# Patient Record
Sex: Female | Born: 1965 | Race: White | Hispanic: No | Marital: Married | Smoking: Never smoker
Health system: Southern US, Community
[De-identification: ages and names within clinical notes are randomized; demographics above are authoritative.]

## PROBLEM LIST (undated history)

## (undated) DIAGNOSIS — I10 Essential (primary) hypertension: Secondary | ICD-10-CM

## (undated) DIAGNOSIS — E119 Type 2 diabetes mellitus without complications: Secondary | ICD-10-CM

## (undated) HISTORY — PX: CHOLECYSTECTOMY OPEN: SUR202

## (undated) HISTORY — PX: TONSILLECTOMY: SUR1361

## (undated) HISTORY — PX: REFRACTIVE SURGERY: SHX103

## (undated) HISTORY — PX: PILONIDAL CYST EXCISION: SHX744

## (undated) HISTORY — DX: Essential (primary) hypertension: I10

## (undated) HISTORY — DX: Type 2 diabetes mellitus without complications: E11.9

---

## 1999-08-30 ENCOUNTER — Encounter: Admission: RE | Admit: 1999-08-30 | Discharge: 1999-11-28 | Payer: Self-pay | Admitting: Internal Medicine

## 1999-09-14 ENCOUNTER — Emergency Department (HOSPITAL_COMMUNITY): Admission: EM | Admit: 1999-09-14 | Discharge: 1999-09-14 | Payer: Self-pay | Admitting: Emergency Medicine

## 1999-11-24 ENCOUNTER — Emergency Department (HOSPITAL_COMMUNITY): Admission: EM | Admit: 1999-11-24 | Discharge: 1999-11-24 | Payer: Self-pay | Admitting: Emergency Medicine

## 2001-01-27 ENCOUNTER — Other Ambulatory Visit: Admission: RE | Admit: 2001-01-27 | Discharge: 2001-01-27 | Payer: Self-pay | Admitting: Obstetrics and Gynecology

## 2002-09-12 ENCOUNTER — Encounter: Payer: Self-pay | Admitting: Emergency Medicine

## 2002-09-12 ENCOUNTER — Inpatient Hospital Stay (HOSPITAL_COMMUNITY): Admission: EM | Admit: 2002-09-12 | Discharge: 2002-09-13 | Payer: Self-pay | Admitting: Emergency Medicine

## 2005-07-21 ENCOUNTER — Observation Stay (HOSPITAL_COMMUNITY): Admission: EM | Admit: 2005-07-21 | Discharge: 2005-07-22 | Payer: Self-pay | Admitting: Internal Medicine

## 2008-07-24 ENCOUNTER — Emergency Department (HOSPITAL_COMMUNITY): Admission: EM | Admit: 2008-07-24 | Discharge: 2008-07-24 | Payer: Self-pay | Admitting: Emergency Medicine

## 2009-08-07 ENCOUNTER — Emergency Department (HOSPITAL_COMMUNITY): Admission: EM | Admit: 2009-08-07 | Discharge: 2009-08-07 | Payer: Self-pay | Admitting: Emergency Medicine

## 2009-10-16 ENCOUNTER — Inpatient Hospital Stay (HOSPITAL_COMMUNITY): Admission: EM | Admit: 2009-10-16 | Discharge: 2009-10-18 | Payer: Self-pay | Admitting: Emergency Medicine

## 2009-10-16 ENCOUNTER — Ambulatory Visit: Payer: Self-pay | Admitting: Cardiovascular Disease

## 2009-10-17 ENCOUNTER — Encounter (INDEPENDENT_AMBULATORY_CARE_PROVIDER_SITE_OTHER): Payer: Self-pay | Admitting: Internal Medicine

## 2010-02-27 IMAGING — CT CT HEAD W/O CM
4 of 5 series · 15 of 47 positions shown, 17 images · non-contrast
Comparison: None

CLINICAL DATA: Fall.  Head trauma.  Syncope.

CT HEAD WITHOUT CONTRAST
TECHNIQUE: Contiguous axial images were obtained from the base of
the skull through the vertex without contrast.

[Series 2: headseq 4.8 h37s · axial · 0.43mm/px · z∈[+313,+373]mm · 2 of 36 slices shown]
[im 12/36  brain]
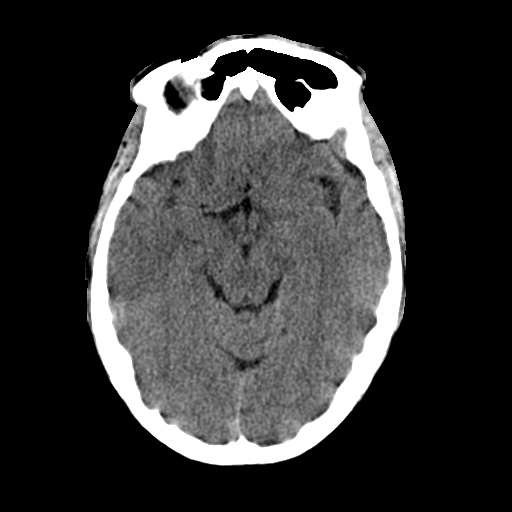
[im 24/36  brain]
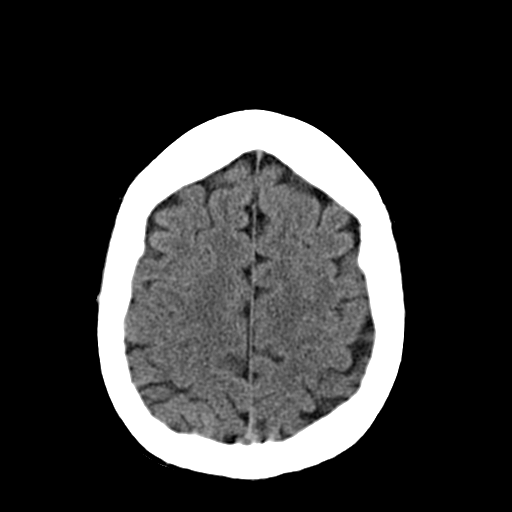

[Series 3: headseq 2.4 h60s · axial · 0.43mm/px · z∈[+280,+414]mm · 7 of 72 slices shown, 9 images]
[im 9/72  brain]
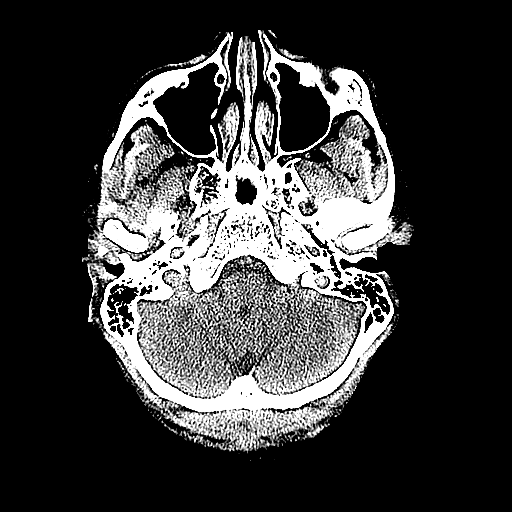
[im 9/72  bone]
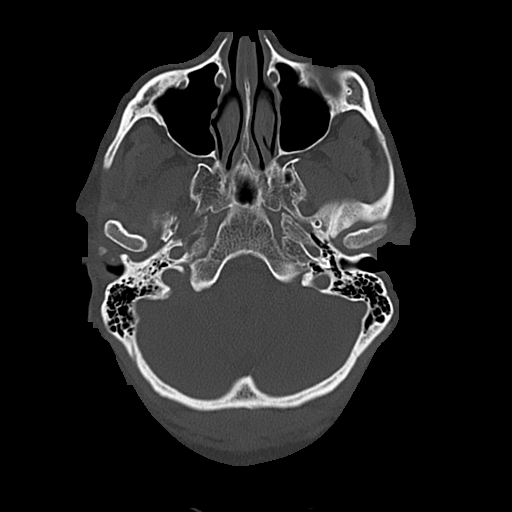
[im 18/72  brain]
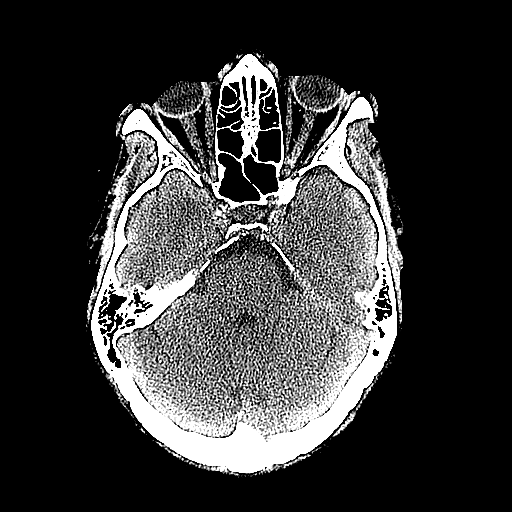
[im 27/72  brain]
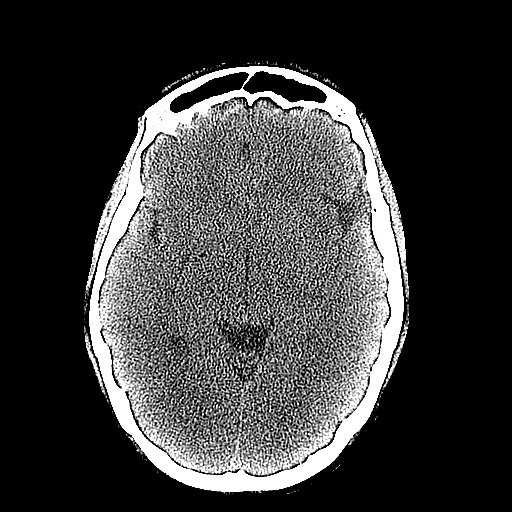
[im 36/72  brain]
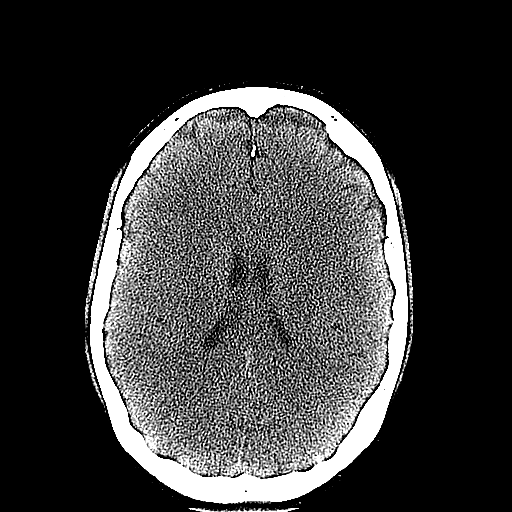
[im 45/72  brain]
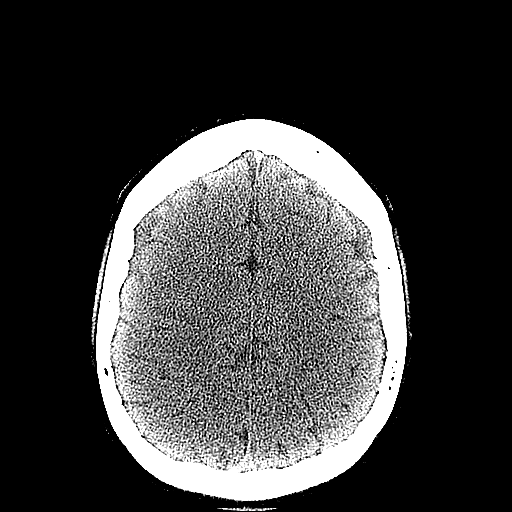
[im 45/72  bone]
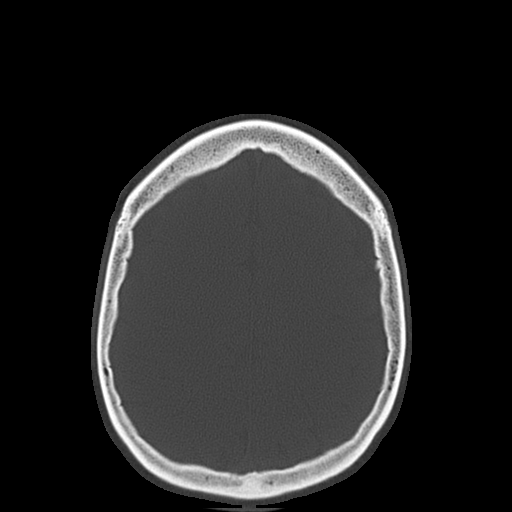
[im 54/72  brain]
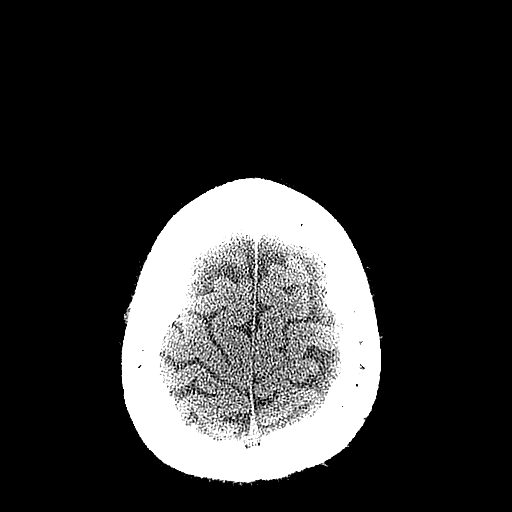
[im 63/72  brain]
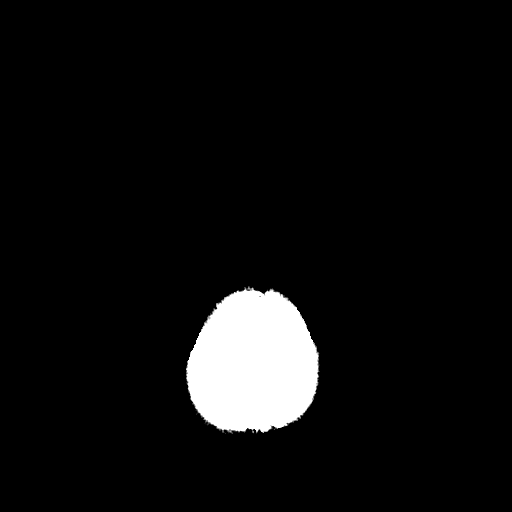

[Series 6: cervical 2.0 spo cor · coronal · 0.27mm/px · 3 of 75 slices shown]
[im 25/75  brain]
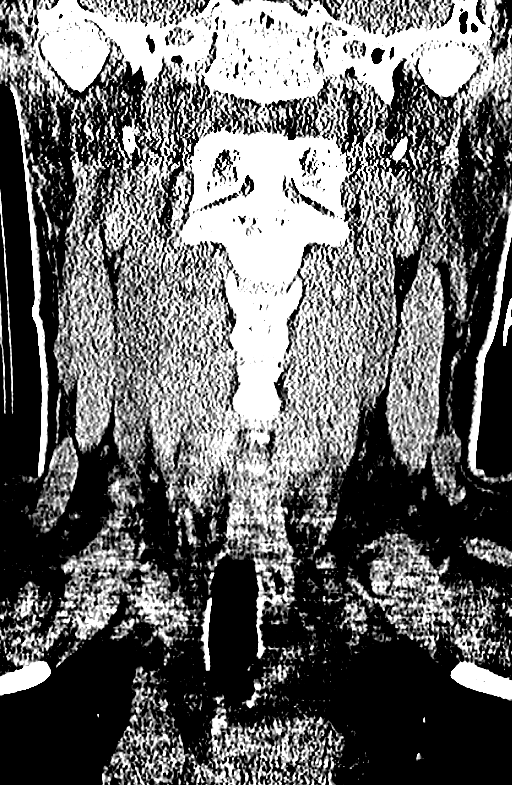
[im 33/75  brain]
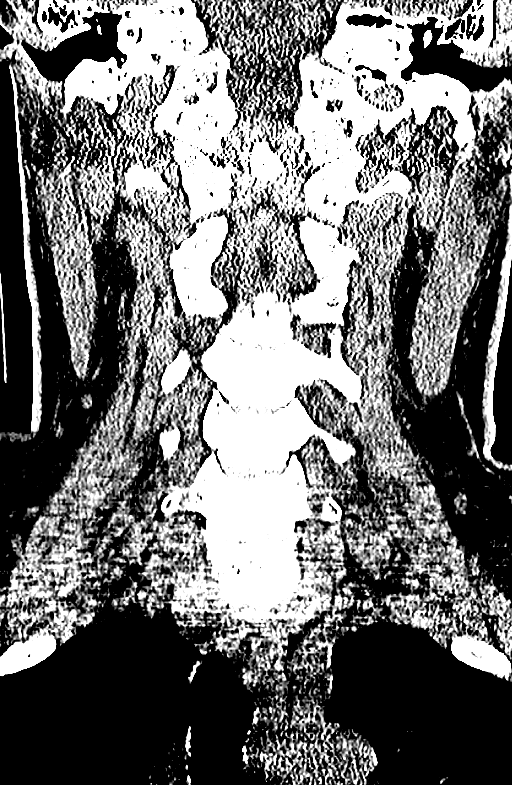
[im 42/75  brain]
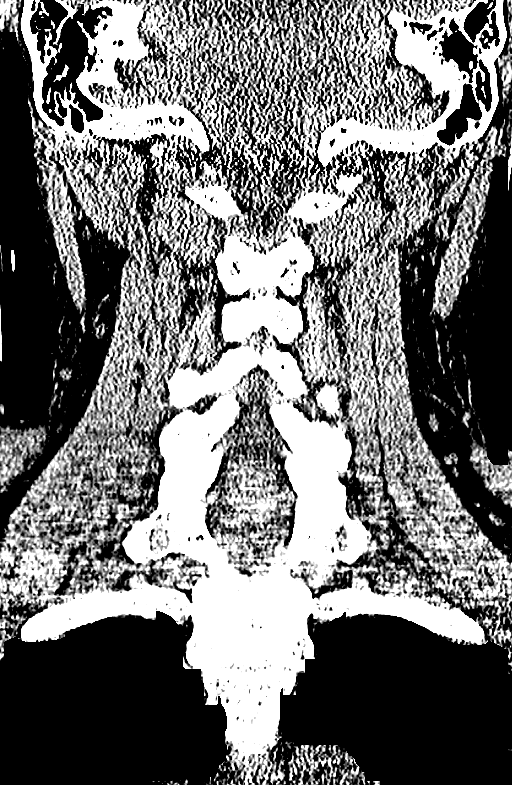

[Series 7: cervical 2.0 spo sag · sagittal · 0.27mm/px · 3 of 65 slices shown]
[im 22/65  brain]
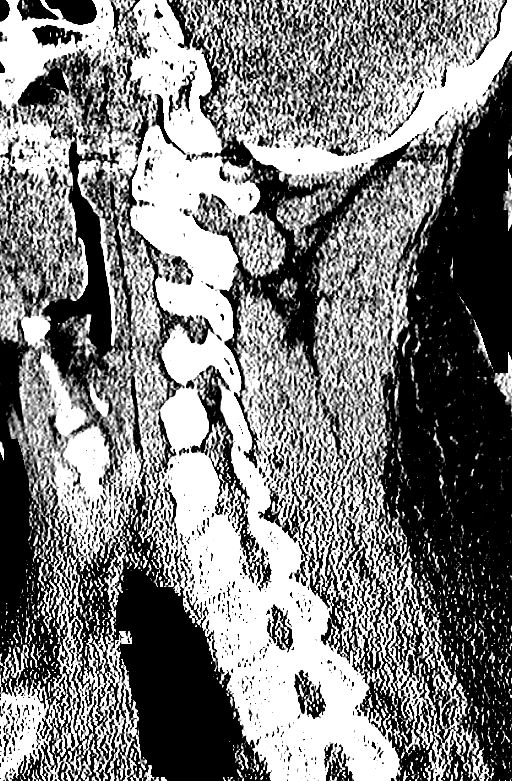
[im 33/65  brain]
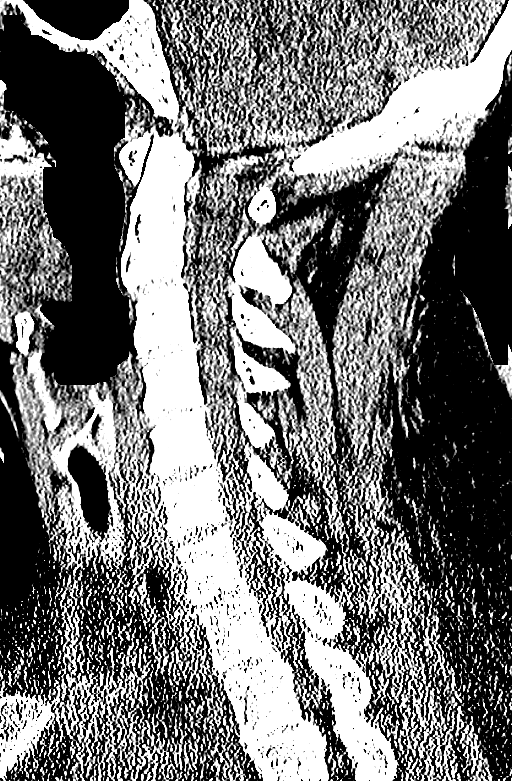
[im 43/65  brain]
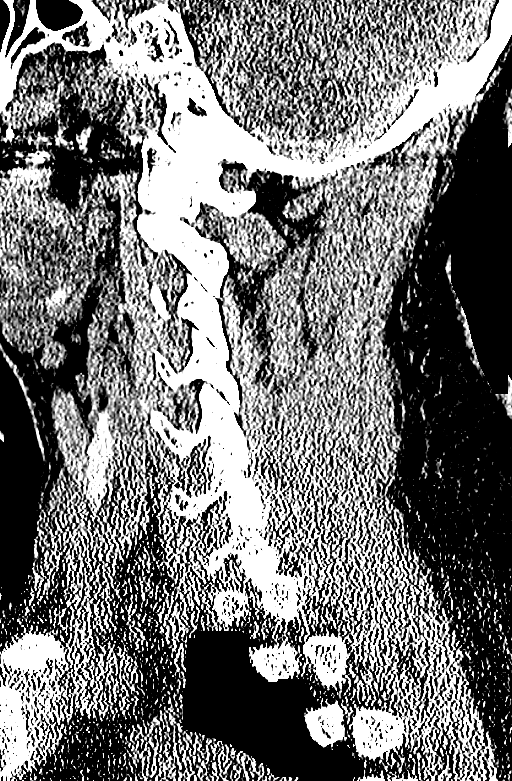

[15 of 47 positions shown; findings below may reference images not displayed]

FINDINGS: The brain has a normal appearance without evidence of
atrophy, infarction, mass lesion, hemorrhage, hydrocephalus or
extra-axial collection.  Sinuses, middle ears and mastoids are
clear.
IMPRESSION: Normal examination

## 2010-07-16 LAB — DIFFERENTIAL
Basophils Absolute: 0 K/uL (ref 0.0–0.1)
Basophils Relative: 0 % (ref 0–1)
Basophils Relative: 0 % (ref 0–1)
Basophils Relative: 0 % (ref 0–1)
Eosinophils Absolute: 0 K/uL (ref 0.0–0.7)
Eosinophils Relative: 0 % (ref 0–5)
Eosinophils Relative: 0 % (ref 0–5)
Lymphocytes Relative: 17 % (ref 12–46)
Lymphocytes Relative: 8 % — ABNORMAL LOW (ref 12–46)
Lymphs Abs: 1.1 10*3/uL (ref 0.7–4.0)
Lymphs Abs: 2 10*3/uL (ref 0.7–4.0)
Lymphs Abs: 2.2 K/uL (ref 0.7–4.0)
Monocytes Absolute: 0.5 10*3/uL (ref 0.1–1.0)
Monocytes Absolute: 0.8 K/uL (ref 0.1–1.0)
Monocytes Relative: 3 % (ref 3–12)
Monocytes Relative: 7 % (ref 3–12)
Monocytes Relative: 8 % (ref 3–12)
Neutro Abs: 3 10*3/uL (ref 1.7–7.7)
Neutro Abs: 9.5 K/uL — ABNORMAL HIGH (ref 1.7–7.7)
Neutrophils Relative %: 54 % (ref 43–77)
Neutrophils Relative %: 76 % (ref 43–77)
Neutrophils Relative %: 89 % — ABNORMAL HIGH (ref 43–77)

## 2010-07-16 LAB — BASIC METABOLIC PANEL WITH GFR
BUN: 16 mg/dL (ref 6–23)
BUN: 17 mg/dL (ref 6–23)
BUN: 19 mg/dL (ref 6–23)
BUN: 19 mg/dL (ref 6–23)
CO2: 17 meq/L — ABNORMAL LOW (ref 19–32)
CO2: 20 meq/L (ref 19–32)
CO2: 23 meq/L (ref 19–32)
CO2: 24 meq/L (ref 19–32)
Calcium: 8.1 mg/dL — ABNORMAL LOW (ref 8.4–10.5)
Calcium: 8.3 mg/dL — ABNORMAL LOW (ref 8.4–10.5)
Calcium: 8.6 mg/dL (ref 8.4–10.5)
Calcium: 8.6 mg/dL (ref 8.4–10.5)
Chloride: 106 meq/L (ref 96–112)
Chloride: 106 meq/L (ref 96–112)
Chloride: 108 meq/L (ref 96–112)
Chloride: 108 meq/L (ref 96–112)
Creatinine, Ser: 0.9 mg/dL (ref 0.4–1.2)
Creatinine, Ser: 0.92 mg/dL (ref 0.4–1.2)
Creatinine, Ser: 0.94 mg/dL (ref 0.4–1.2)
Creatinine, Ser: 0.99 mg/dL (ref 0.4–1.2)
GFR calc Af Amer: >60 mL/min (ref >60)
GFR calc Af Amer: >60 mL/min (ref >60)
GFR calc Af Amer: >60 mL/min (ref >60)
GFR calc Af Amer: >60 mL/min (ref >60)
GFR calc non Af Amer: >60 mL/min (ref >60)
GFR calc non Af Amer: >60 mL/min (ref >60)
GFR calc non Af Amer: >60 mL/min (ref >60)
GFR calc non Af Amer: >60 mL/min (ref >60)
Glucose, Bld: 250 mg/dL — ABNORMAL HIGH (ref 70–99)
Glucose, Bld: 289 mg/dL — ABNORMAL HIGH (ref 70–99)
Glucose, Bld: 367 mg/dL — ABNORMAL HIGH (ref 70–99)
Glucose, Bld: 385 mg/dL — ABNORMAL HIGH (ref 70–99)
Potassium: 3.9 meq/L (ref 3.5–5.1)
Potassium: 4 meq/L (ref 3.5–5.1)
Potassium: 4.7 meq/L (ref 3.5–5.1)
Potassium: 5.4 meq/L — ABNORMAL HIGH (ref 3.5–5.1)
Sodium: 133 meq/L — ABNORMAL LOW (ref 135–145)
Sodium: 133 meq/L — ABNORMAL LOW (ref 135–145)
Sodium: 135 meq/L (ref 135–145)
Sodium: 137 meq/L (ref 135–145)

## 2010-07-16 LAB — GLUCOSE, CAPILLARY
Glucose-Capillary: 107 mg/dL — ABNORMAL HIGH (ref 70–99)
Glucose-Capillary: 138 mg/dL — ABNORMAL HIGH (ref 70–99)
Glucose-Capillary: 151 mg/dL — ABNORMAL HIGH (ref 70–99)
Glucose-Capillary: 169 mg/dL — ABNORMAL HIGH (ref 70–99)
Glucose-Capillary: 186 mg/dL — ABNORMAL HIGH (ref 70–99)
Glucose-Capillary: 221 mg/dL — ABNORMAL HIGH (ref 70–99)
Glucose-Capillary: 241 mg/dL — ABNORMAL HIGH (ref 70–99)
Glucose-Capillary: 276 mg/dL — ABNORMAL HIGH (ref 70–99)
Glucose-Capillary: 297 mg/dL — ABNORMAL HIGH (ref 70–99)
Glucose-Capillary: 505 mg/dL — ABNORMAL HIGH (ref 70–99)

## 2010-07-16 LAB — COMPREHENSIVE METABOLIC PANEL WITH GFR
ALT: 16 U/L (ref 0–35)
AST: 19 U/L (ref 0–37)
Albumin: 2.7 g/dL — ABNORMAL LOW (ref 3.5–5.2)
Alkaline Phosphatase: 33 U/L — ABNORMAL LOW (ref 39–117)
BUN: 14 mg/dL (ref 6–23)
CO2: 23 meq/L (ref 19–32)
Calcium: 8.4 mg/dL (ref 8.4–10.5)
Chloride: 112 meq/L (ref 96–112)
Creatinine, Ser: 0.75 mg/dL (ref 0.4–1.2)
GFR calc Af Amer: >60 mL/min (ref >60)
GFR calc non Af Amer: >60 mL/min (ref >60)
Glucose, Bld: 166 mg/dL — ABNORMAL HIGH (ref 70–99)
Potassium: 3.9 meq/L (ref 3.5–5.1)
Sodium: 139 meq/L (ref 135–145)
Total Bilirubin: 0.3 mg/dL (ref 0.3–1.2)
Total Protein: 5.5 g/dL — ABNORMAL LOW (ref 6.0–8.3)

## 2010-07-16 LAB — CBC
HCT: 39.2 % (ref 36.0–46.0)
Hemoglobin: 11.4 g/dL — ABNORMAL LOW (ref 12.0–15.0)
Hemoglobin: 13.2 g/dL (ref 12.0–15.0)
MCHC: 33.4 g/dL (ref 30.0–36.0)
MCHC: 33.5 g/dL (ref 30.0–36.0)
Platelets: 224 10*3/uL (ref 150–400)
RDW: 14.1 % (ref 11.5–15.5)
RDW: 14.4 % (ref 11.5–15.5)
WBC: 14.4 10*3/uL — ABNORMAL HIGH (ref 4.0–10.5)

## 2010-07-16 LAB — URINE MICROSCOPIC-ADD ON

## 2010-07-16 LAB — BASIC METABOLIC PANEL
BUN: 18 mg/dL (ref 6–23)
BUN: 18 mg/dL (ref 6–23)
CO2: 17 mEq/L — ABNORMAL LOW (ref 19–32)
CO2: 21 mEq/L (ref 19–32)
CO2: 21 mEq/L (ref 19–32)
Calcium: 8.2 mg/dL — ABNORMAL LOW (ref 8.4–10.5)
Calcium: 8.5 mg/dL (ref 8.4–10.5)
Chloride: 109 mEq/L (ref 96–112)
Chloride: 110 mEq/L (ref 96–112)
Creatinine, Ser: 0.8 mg/dL (ref 0.4–1.2)
Creatinine, Ser: 0.82 mg/dL (ref 0.4–1.2)
Creatinine, Ser: 0.86 mg/dL (ref 0.4–1.2)
GFR calc Af Amer: >60 mL/min (ref >60)
GFR calc non Af Amer: 58 mL/min — ABNORMAL LOW (ref >60)
Glucose, Bld: 152 mg/dL — ABNORMAL HIGH (ref 70–99)
Glucose, Bld: 264 mg/dL — ABNORMAL HIGH (ref 70–99)
Glucose, Bld: 268 mg/dL — ABNORMAL HIGH (ref 70–99)
Glucose, Bld: 305 mg/dL — ABNORMAL HIGH (ref 70–99)
Potassium: 3.7 mEq/L (ref 3.5–5.1)
Potassium: 5.1 mEq/L (ref 3.5–5.1)
Sodium: 134 mEq/L — ABNORMAL LOW (ref 135–145)
Sodium: 136 mEq/L (ref 135–145)

## 2010-07-16 LAB — URINALYSIS, ROUTINE W REFLEX MICROSCOPIC
Bilirubin Urine: NEGATIVE
Protein, ur: NEGATIVE mg/dL
Specific Gravity, Urine: 1.02 (ref 1.005–1.030)
Urobilinogen, UA: 0.2 mg/dL (ref 0.0–1.0)

## 2010-07-16 LAB — COMPREHENSIVE METABOLIC PANEL
AST: 23 U/L (ref 0–37)
BUN: 21 mg/dL (ref 6–23)
CO2: 15 mEq/L — ABNORMAL LOW (ref 19–32)
Creatinine, Ser: 1.05 mg/dL (ref 0.4–1.2)
Potassium: 4.7 mEq/L (ref 3.5–5.1)
Sodium: 132 mEq/L — ABNORMAL LOW (ref 135–145)
Total Protein: 7 g/dL (ref 6.0–8.3)

## 2010-07-16 LAB — LIPASE, BLOOD: Lipase: 17 U/L (ref 11–59)

## 2010-07-16 LAB — LIPID PANEL
Cholesterol: 78 mg/dL (ref 0–200)
HDL: 47 mg/dL (ref >39)
LDL Cholesterol: 21 mg/dL (ref 0–99)
Total CHOL/HDL Ratio: 1.7 ratio
Triglycerides: 52 mg/dL (ref <150)
VLDL: 10 mg/dL (ref 0–40)

## 2010-07-16 LAB — URINE CULTURE: Culture: NO GROWTH

## 2010-07-16 LAB — KETONES, QUALITATIVE

## 2010-07-16 LAB — HEMOGLOBIN A1C
Hgb A1c MFr Bld: 7.1 % — ABNORMAL HIGH (ref <5.7)
Mean Plasma Glucose: 157 mg/dL — ABNORMAL HIGH (ref <117)

## 2010-07-19 LAB — PREGNANCY, URINE: Preg Test, Ur: NEGATIVE

## 2010-07-19 LAB — CBC
HCT: 38.3 % (ref 36.0–46.0)
Hemoglobin: 13.3 g/dL (ref 12.0–15.0)
MCHC: 34.6 g/dL (ref 30.0–36.0)
MCV: 92.6 fL (ref 78.0–100.0)
RBC: 4.13 MIL/uL (ref 3.87–5.11)

## 2010-07-19 LAB — DIFFERENTIAL
Basophils Relative: 0 % (ref 0–1)
Eosinophils Absolute: 0 10*3/uL (ref 0.0–0.7)
Eosinophils Relative: 0 % (ref 0–5)
Monocytes Absolute: 0.6 10*3/uL (ref 0.1–1.0)
Monocytes Relative: 5 % (ref 3–12)

## 2010-07-19 LAB — URINALYSIS, ROUTINE W REFLEX MICROSCOPIC
Hgb urine dipstick: NEGATIVE
Nitrite: NEGATIVE
Specific Gravity, Urine: <1.005 — ABNORMAL LOW (ref 1.005–1.030)
Urobilinogen, UA: 0.2 mg/dL (ref 0.0–1.0)
pH: 7 (ref 5.0–8.0)

## 2010-07-19 LAB — BASIC METABOLIC PANEL
CO2: 24 mEq/L (ref 19–32)
Chloride: 99 mEq/L (ref 96–112)
GFR calc Af Amer: >60 mL/min (ref >60)
Sodium: 131 mEq/L — ABNORMAL LOW (ref 135–145)

## 2010-07-19 LAB — GLUCOSE, CAPILLARY: Glucose-Capillary: 145 mg/dL — ABNORMAL HIGH (ref 70–99)

## 2010-07-19 LAB — URINE MICROSCOPIC-ADD ON

## 2010-07-19 LAB — URINE CULTURE

## 2010-09-15 NOTE — H&P (Signed)
NAME:  Christine Hess, Christine Hess                           ACCOUNT NO.:  [id number]   MEDICAL RECORD NO.:  [id number]                   PATIENT TYPE:  INP   LOCATION:  5705                                 FACILITY:  [facility]   PHYSICIAN:  Tera Mater. Evlyn Kanner, M.D.              DATE OF BIRTH:  1965-07-10   DATE OF ADMISSION:  09/12/2002  DATE OF DISCHARGE:                                HISTORY & PHYSICAL   HISTORY OF PRESENT ILLNESS:  Ms. Vannest is a 45 year old female, with known  type 1 diabetes since age of 31, who presents in diabetic ketoacidosis.  She  has had pump site dysfunction, requiring changing out of the set several  times in the recent days.  Blood sugar was okay last night, was up over 400  this morning, and this afternoon, despite giving what appeared to be extra  boluses, she still has nausea, vomiting and chest burning.  She has had some  sinus drainage that has been clear.  No fever, chills, sweats or significant  headaches.  She has had no diarrhea or GI symptoms prior to this.  She had  no neurologic symptoms, and no unusual travel exposure or rashes.  Her blood  sugars appears to have been under pretty good control, but the pump  dysfunction, apparently in the last few days, has caused them to be in the  200s and 400s occasionally.  The last episode of DKA was in high school.  She was recently seen for evaluation and was doing quite well with labs okay  except proteinuria.  Blood sugar was greater than 600, on arrival was  diminished bicarbonate and pH.  She did have some retching of clear bilious  material.  The chest burning she had briefly is gone away.  She denies any  blurred vision, no neuropathic complaints.   PAST MEDICAL HISTORY:  Type 1 diabetes at the age of 76.  She denies  retinopathy and neuropathy.  She did have proteinuria to a moderate degree.   MEDICATIONS:  Humalog and Accupril.   ALLERGIES:  Novocaine.   PAST SURGICAL HISTORY:  Cholecystectomy, pilonidal  cyst and tonsils.   SOCIAL HISTORY:  She has been married nine years, has no children.  She is a  nonsmoker.   FAMILY HISTORY:  Dad has type 2 diabetes, mother has hypertension and  fibromyalgia.   EXAMINATION:  VITAL SIGNS:  On exam blood pressure 102/39, pulse 107,  respirations 20, temperature 96.9, saturation is 96% on room air.  GENERAL:  We have a nontoxic appearing white female, sitting up in the  stretcher in no distress.  HEENT:  __________.  Sclerae anicteric, fundi are intact.  No lid lag or  exophthalmus present.  Pupils are equal and reactive.  Extraocular movements  intact, with no nystagmus.  Tongue is somewhat coated, with a yellowish  coating.  No dark areas or thrush is seen,  some dryness is noted.  Tympanic  membranes are normal bilaterally.  NECK:  Supple without JVD, bruits or thyromegaly.  LUNGS:  Clear without wheezes, rales, rhonchi, no Kussmaul pattern of  respirations present, no accessory muscle use.  HEART:  Tachycardic and regular without murmur.  ABDOMEN:  Soft, nontender, nondistended, with no masses or  hepatosplenomegaly, normoactive to slightly hypoactive bowel sounds are  present.  EXTREMITIES:  Reveal trace to 1+ edema bilaterally, with intact pulses.  No  foot lesions are present.  Capillary refill is good, no cyanosis is noted.  SKIN:  Warm and dry without rashes or pigmentary changes.  NEUROLOGIC:  She is awake, alert, mentating perfectly well.  Speech is  clear.  No tremors present.  No anxiety, depression, hallucinations or  delusions present.  Sensory is intact to light touch.   LABORATORY DATA:  A pH 7.313, pCO2 of 31, pO2 of 131, bicarb 16, sodium 133,  potassium 4.0, chloride 103, CO2 24, BUN 24, creatinine 0.5, glucose 221, CK  24, troponin 0.01.  Urinalysis 1.033 with pH of 6, glucose greater than  1,000, trace hemoglobin and ketones greater than 80 mg%, total protein 30,  serum acetone is small, white count 17,600, hemoglobin 13.3,  platelets  830,000.  Echocardiogram with a sinus rhythm, with  no ischemic changes, no  LVH, or evidence of old injury.   SUMMARY:  A 45 year old white female with known type 1 diabetes presenting  with significantly moderate DKA.  She had some fluid deficits, but no  significant electrolyte imbalance or disarray.  Her white count is up,  likely due to the DKA and the vomiting.  This all seems likely related to  pump dysfunction from all the history that is given and the examination.  No  evidence of infection or cardiac decompensation as a cause of this.  Initial  enzymes are negative, with a normal EKG in the setting, I do not think that  serial enzymes are warranted.  I believe she can turn around with IV  hydration and IV insulin with Glucometer.  She has already gotten some  Zofran and her nausea is gone, she is able to take some fluids.  She does  have an old pump that she can substitute for this new one, that seems to be  causing dysfunction.  We can get her husband bring it in, and perhaps let  her go tomorrow, if that is working well.  We will watch carefully for signs  of annoying infection or other reasons for decompensation.  At the present  time she is hemodynamically stable as well.  She will get aggressive  hydration for the next several hours and a careful watch of her  electrolytes.                                                Tera Mater. Evlyn Kanner, M.D.    SAS/MEDQ  D:  09/12/2002  T:  09/12/2002  Job:  [id number]

## 2010-09-15 NOTE — H&P (Signed)
NAMEKIELY, COUSAR                 ACCOUNT NO.:  [id number]   MEDICAL RECORD NO.:  [id number]          PATIENT TYPE:  INP   LOCATION:  2310                         FACILITY:  [facility]   PHYSICIAN:  Kari Baars, M.D.  DATE OF BIRTH:  05-23-1965   DATE OF ADMISSION:  07/20/2005  DATE OF DISCHARGE:  07/22/2005                                HISTORY & PHYSICAL   CHIEF COMPLAINT:  Nausea and vomiting.   HISTORY OF PRESENT ILLNESS:  Ms. Jarnagin is a 45 year old white female with  type 1 diabetes mellitus, on an insulin pump, who presented to the emergency  department twice today with a complaint of nausea, vomiting and diarrhea.  The patient reports that she was in her usual state of health this past  week, except for some mild URI/allergy symptoms.  She was actually seen in  the office yesterday and upgraded her pump from a MiniMed to an [place] after  receiving adequate training.  She felt very comfortable with the pump, and  it was initiated in the office.  This morning when she woke up, she felt  fatigued.  Later in the morning, her sugar climbed to over 400, and she  developed some nausea, vomiting and diarrhea.  She presented to the  emergency department early in the afternoon where her bicarbonate was 16 and  she had an elevated white blood count.  They told her that she had a viral  illness and discharged her home with antiemetic and antidiarrheal therapy.  Despite this, she continued to have nausea and vomiting and became  progressively more fatigued and developed some arm pain.  She felt that she  was having more shallow breathing and presented to the emergency department  where she was found to have an increased anion gap acidosis with ketones in  her urine.  She has been given 10 units of regular insulin and 1 liter of  fluid, and I have been asked to see her for further management.  The patient  has had a prior episode of DKA in 2004 due to a pump malfunction due to the  tubing.  She has been well controlled since then with an A1C recently of  6.7.  She does state that she has some retinopathy, but no known nephropathy  or neuropathy.   PAST MEDICAL HISTORY:  1.  Type 1 diabetes on insulin pump (A1C 6.7).  2.  Hypertension.   MEDICATIONS:  1.  Aspirin 81 mg daily.  2.  Accuretic, unknown dose.  3.  Animas pump.  4.  Multivitamin.   ALLERGIES:  NOVOCAIN.   SOCIAL HISTORY:  She lives in [place] with her husband.  They have no  children.  She is a Aeronautical engineer.  No tobacco use.   FAMILY HISTORY:  Father has lung cancer and type 2 diabetes.  Brother has  impaired glucose tolerance and bipolar disorder.  Mother has hypertension  and fibromyalgia.   REVIEW OF SYSTEMS:  All systems were reviewed with the patient and were  negative, except in the HPI, with the following exceptions.  She  does have  some mild chest fullness, but no chest pain.  Bilateral arm pain.  No cough.   PHYSICAL EXAMINATION:  VITAL SIGNS:  Temperature 98.1, blood pressure  125/66, pulse 123, respirations 20, oxygen saturation 100% on room air.  GENERAL:  A dehydrated, fatigued white female in no respiratory distress.  HEENT:  Oropharynx is dry.  Eyes are sunken.  NECK:  Supple without lymphadenopathy, JVD or carotid bruits.  HEART:  Tachycardic without murmurs, rubs, or gallops.  LUNGS:  Clear to auscultation bilaterally.  ABDOMEN:  Soft, nondistended, nontender, with normoactive bowel sounds.  EXTREMITIES:  No clubbing, cyanosis, or edema.  NEUROLOGIC:  Alert and oriented x4.  No disorientation.  Nonfocal exam.   LABORATORY DATA:  CBC significant for a white count of 17.4, hemoglobin  11.9, platelets 338.  BMET significant for a sodium of 136, potassium 5.6,  chloride 106, bicarbonate 16, BUN 22, creatinine 1.2, glucose 494.  Urinalysis with 0-2 white blood cells, ketones greater than 80, glucose  greater than 1000.   ASSESSMENT AND PLAN:  1.  Diabetic  ketoacidosis.  Will admit her to the ICU for aggressive      hydration, insulin drip per Glucommander protocol, and close monitoring      of her ketoacidosis.  When her sugars are less than 250, will change her      IV fluids to D5W and continue the insulin drip until her acidosis      resolves.  This episode is likely due to pump malfunction, and we will      ask the diabetes coordinators to assist [place] with interrogating the pump.      Once she is stable, we may be able to transition her back onto an      insulin pump, but it may be best to resume her prior pump at this point.  2.  Nausea/vomiting, diarrhea.  This is likely due to #1 rather than the      underlying cause.  Will continue supportive therapy with antiemetics and      hydration.  Obtain a urine pregnancy, although the patient states that      she does not think that she could be pregnant.  3.  Hypertension.  Hold Accuretic for now.  Will monitor.  4.  Disposition.  Anticipate discharge in 48-72 hours if she is stable and      able to be transitioned back to her insulin pump.  5.  Fluids, electrolytes, and nutrition.  NPO for now.  Will transition to a      clear liquid diet as tolerated in the morning.      Kari Baars, M.D.  Electronically Signed     WS/MEDQ  D:  07/21/2005  T:  07/23/2005  Job:  [id number]   cc:   Gwen Pounds, MD  Fax: [phone]

## 2010-09-15 NOTE — Discharge Summary (Signed)
NAME:  Christine Hess, Christine Hess                           ACCOUNT NO.:  [id number]   MEDICAL RECORD NO.:  [id number]                   PATIENT TYPE:  INP   LOCATION:  5705                                 FACILITY:  [facility]   PHYSICIAN:  Tera Mater. Evlyn Kanner, M.D.              DATE OF BIRTH:  1965/07/09   DATE OF ADMISSION:  09/12/2002  DATE OF DISCHARGE:  09/13/2002                                 DISCHARGE SUMMARY   DISCHARGE DIAGNOSES:  1. Diabetic ketoacidosis secondary to pump dysfunction, clinically improved     with complete biochemical and clinical resolution.  2. Nausea and vomiting secondary to number 1.  3. Chest pain secondary to number 1 with no evidence of ischemic cardiac     disease or damage.  4. Nephropathy.   HISTORY OF PRESENT ILLNESS:  The patient is a 45 year old white female with  a known history of type 1 diabetes since the age of 57.  She has been on an  insulin pump for several years and has not had any serious decompensation or  hospitalization since her high-school days.  She had pump dysfunction as  outlined within the history and physical, and presented with blood sugar in  excess of 600 with a slightly diminished bicarbonate level and positive  serum ketones.  She was treated with IV fluid resuscitation and IV  __________ and now is being transitioned back to her old insulin pump and  will have to get the new one repaired.  She has done extremely well,  tolerating her diet.  No nausea, vomiting, chest pain or other problems.  She has had no evidence of fluid overload while here.  She is back to her  clinical baseline and feeling quite well.  She is discharged home in  improved condition.   LABORATORY REVIEW:  Her labs today show a white count of 14,600, hemoglobin  11.8, platelets 320,000.  Sodium 140, potassium 3.6, chloride 112, CO2 25,  BUN 19, creatinine 0.8.  Glucose 119, calcium 8.5.  At presentation, sodium  133, potassium 4.8, chloride 103, CO2 22, glucose  621, BUN 24, creatinine  1.1.  Serum ketones were small and now are negative.  An initial hemoglobin  was 13.3.  Initial platelets were 330,000.  Initial white count was 17,000.  A urinalysis was 1.033 specific gravity, pH of 6.0. Glucose greater than 80  mg  per dl ketones and 30 mg percent protein.   A cardiogram in the ED was sinus rhythm with no ischemic changes.  No  evidence of old or new problems.  Initial CPK was 24 with troponin of 0.01.   In summary, we have a 45 year old white female with known type 1 diabetes  presenting with moderately-severe diabetic ketoacidosis due to pump  dysfunction.  Fortunately, she has reversed and is now being discharged  approximately 18 hours after presentation here.  She is to resume her prior  pump settings using her old pump.  She is to call if her blood sugars are  consistently over 250 or drop significantly below 60.  She is to watch  carefully for signs of infection although none were noted while here.  She  is to have follow up in the office in two to three weeks.   MEDICATIONS:  Continue the Accupril and get her back on her pump.                                               Tera Mater. Evlyn Kanner, M.D.    SAS/MEDQ  D:  09/13/2002  T:  09/14/2002  Job:  [id number]

## 2010-09-15 NOTE — Discharge Summary (Signed)
NAMEMICHELINA, Christine Hess                 ACCOUNT NO.:  [id number]   MEDICAL RECORD NO.:  [id number]          PATIENT TYPE:  INP   LOCATION:  2310                         FACILITY:  [facility]   PHYSICIAN:  Kari Baars, M.D.  DATE OF BIRTH:  1966-02-27   DATE OF ADMISSION:  07/21/2005  DATE OF DISCHARGE:  07/22/2005                                 DISCHARGE SUMMARY   DISCHARGE DIAGNOSES:  1.  Diabetic ketoacidosis, likely secondary to insertion site malfunction.  2.  Type 1 diabetes mellitus on animus pump.  3.  Nausea/vomiting secondary to #1.  4.  Sore throat.  5.  Hypertension.   HOSPITAL PROCEDURES:  Insulin drip per Glucomander protocol.   DISCHARGE MEDICATIONS:  1.  Animus pump with Humalog insulin on prior settings.  2.  Aspirin 81 mg daily.  3.  Accuretic resume in 2 days.  4.  Aspirin 81 mg daily.  5.  Magic mouthwash 5 mL q.6h. p.r.n. sore throat.   HISTORY OF PRESENT ILLNESS:  For full details, please see dictated history  and physical.  Briefly, Christine Hess is a pleasant very insightful 45 year old  white female with type 1 diabetes on an insulin pump who presented to the  emergency department on two occasions on July 20, 2005 with nausea,  vomiting and diarrhea.  The patient was in her usual state of health with  some mild allergy symptoms in the week prior to admission.  On the day prior  to presentation, she had a pump upgrade to the new animus pump and which was  initiated in the office.  She initially did well until that evening when her  pump site dislodged.  She changed the tubing site and inserted it on the  other side.  The following morning, her blood sugar was elevated at 400.  She tried to obtain a new insertion site, but her sugar remained elevated.  She presented to the emergency department where her bicarb was 16 and she  had an elevated white blood count.  She was discharged home from the  emergency department initially with a diagnosis of viral  gastroenteritis.  She continued to have elevated blood sugars and nausea, vomiting and  represented to the emergency department where she was found to have  worsening anion gap acidosis with ketones in her urine consistent with  diabetic ketoacidosis.   HOSPITAL COURSE:  The patient was evaluated in the emergency department and  placed on aggressive volume resuscitation and IV insulin drip.  She was  continued on insulin per Glucomander protocol with resolution of her anion  gap acidosis.  Her nausea and vomiting resolved and she was tolerating  p.o.'s.  On the evening prior to discharge, she was started back on her  animus pump and monitored closely in the ICU setting.  Her blood sugar  control remained adequate with this change with blood sugars less than 140  in the 10 hours prior to discharge.  She did have periodic lows due to  aggressive bolusing with the close monitoring but remained above 64 in the  past 10 hours.  At this point, she is tolerating p.o.'s and her pump appears  to be working fine.  Therefore, she is stable for discharge home with  outpatient follow-up.   On the morning of discharge, she does complain of some mild sore throat.  Exam does not reveal any significant exudates or lymphadenopathy.  This  likely due to her nausea and vomiting and pharyngeal irritation.  Magic  mouthwash was prescribed as needed.   DISCHARGE INSTRUCTIONS:  She was instructed to monitor blood sugars closely  over the next few days, checking it at least six to seven times per day.  She should call for blood sugar increases above 300 or she if is unable to  keep p.o.'s down.  She was instructed to call [facility] or Dr. Timothy Lasso if  she has any more concerns or problems with her pump.  At this point, she  feels very comfortable with things.   DISCHARGE LABS:  White blood count on the morning of discharge 10.5,  hemoglobin 10.2, platelets 251,000.  BMET:  Sodium 137, potassium 4.1,   chloride 113, bicarb 20, BUN 14, creatinine 0.8, glucose 127.  This gives  her an anion gap of 4.  Serum pregnancy negative on admission.  Urine  culture multiple species, none predominant.  Likely contaminant.   DISPOSITION:  To home with her husband.      Kari Baars, M.D.  Electronically Signed     WS/MEDQ  D:  07/22/2005  T:  07/23/2005  Job:  [id number]   cc:   Gwen Pounds, MD  Fax: [phone]

## 2011-03-13 IMAGING — CT CT HEAD W/O CM
1 of 2 series · 13 of 30 positions shown, 17 images · non-contrast
Comparison: 07/24/2008

CLINICAL DATA: Dizziness.

CT HEAD WITHOUT CONTRAST
TECHNIQUE: Contiguous axial images were obtained from the base of
the skull through the vertex without contrast.

[Series 2: headseq 4.8 h37s · axial · 0.54mm/px · z∈[+1085,+1232]mm · 13 of 36 slices shown, 17 images]
[im 3/36  brain]
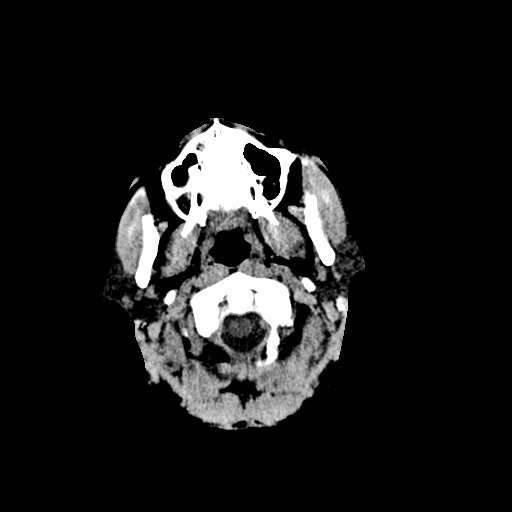
[im 3/36  bone]
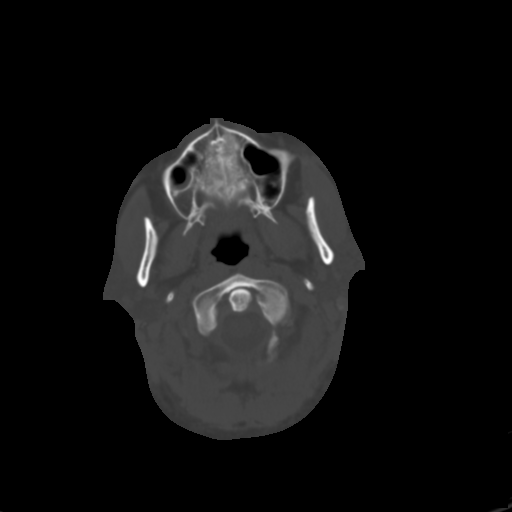
[im 6/36  brain]
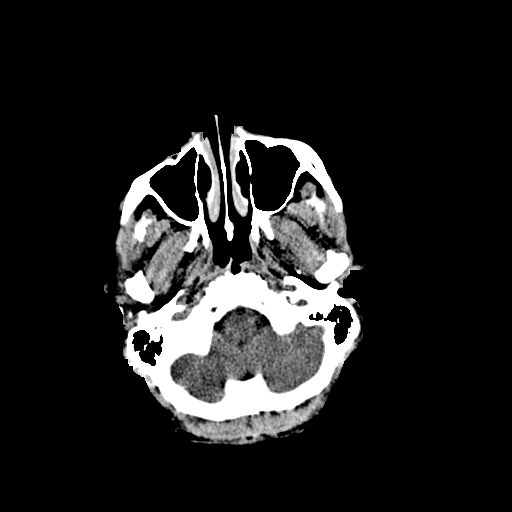
[im 8/36  brain]
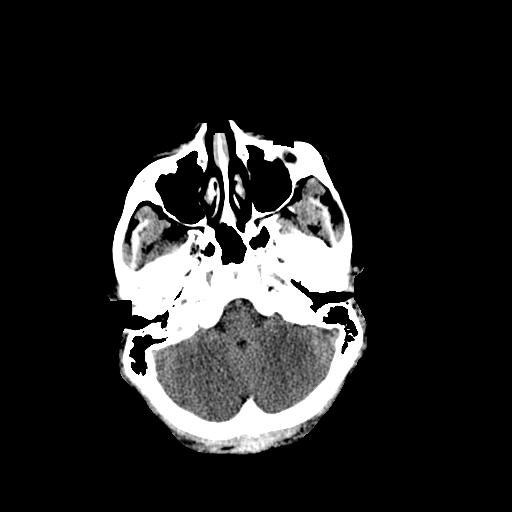
[im 11/36  brain]
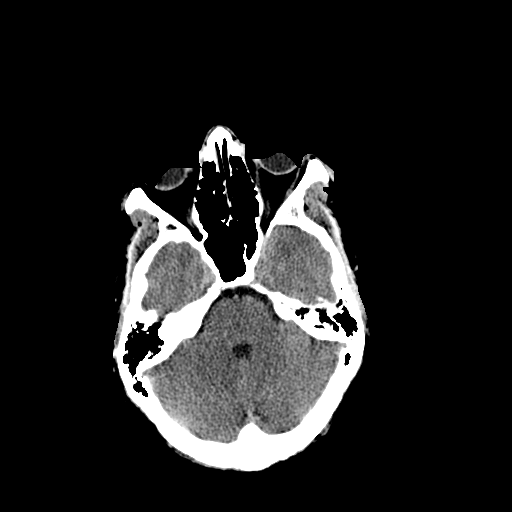
[im 13/36  brain]
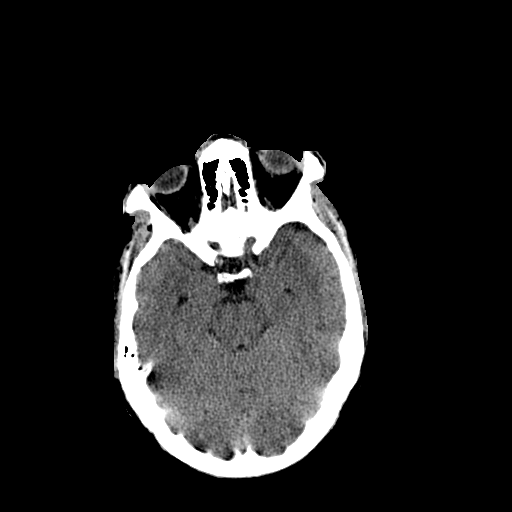
[im 13/36  bone]
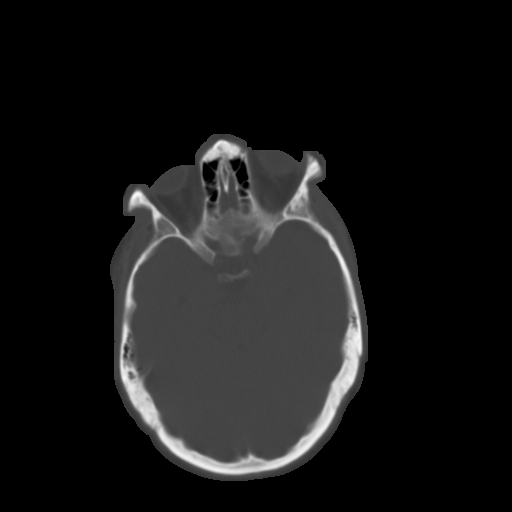
[im 16/36  brain]
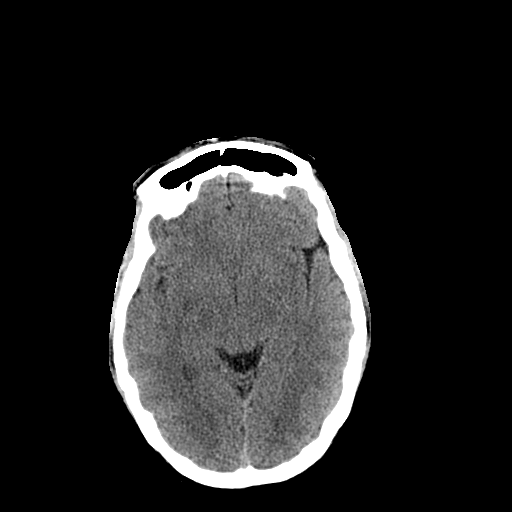
[im 18/36  brain]
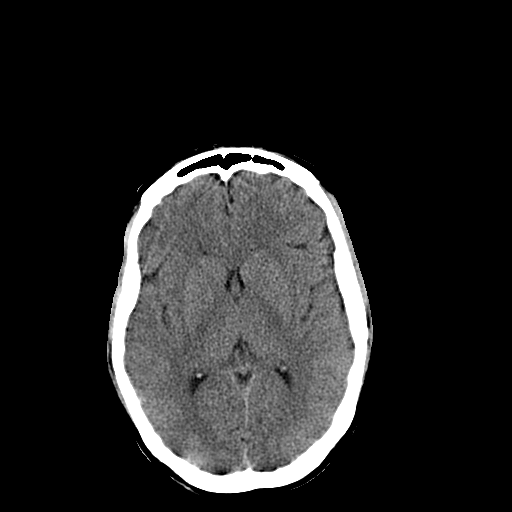
[im 21/36  brain]
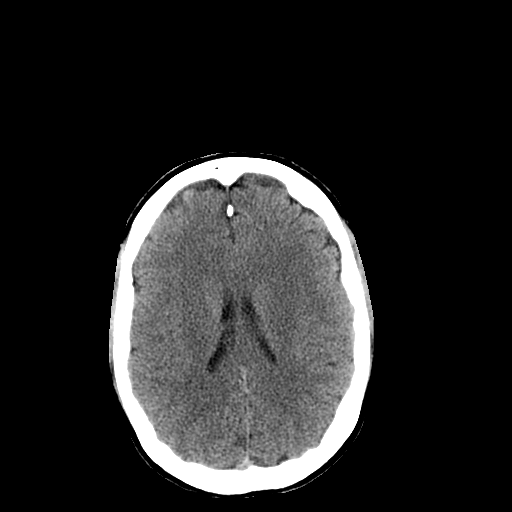
[im 23/36  brain]
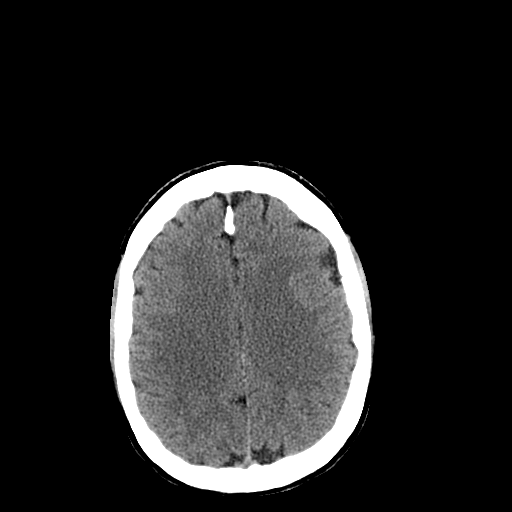
[im 23/36  bone]
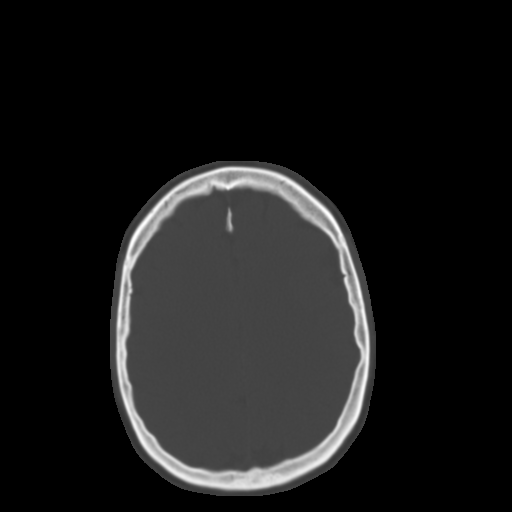
[im 26/36  brain]
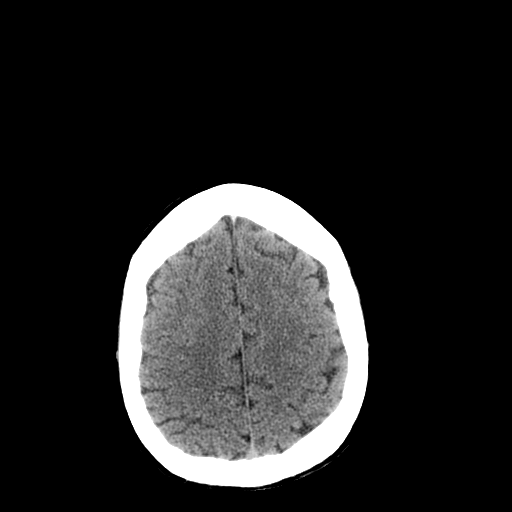
[im 28/36  brain]
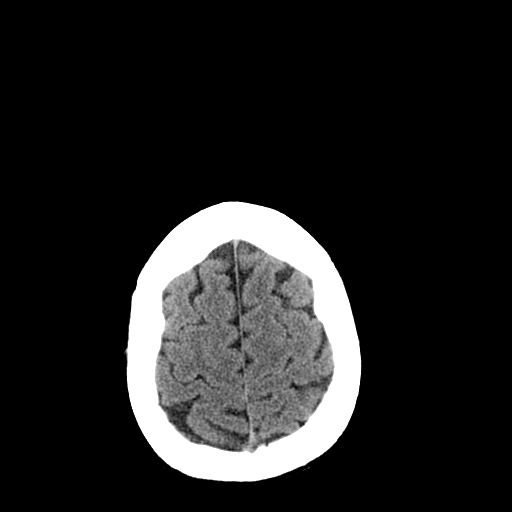
[im 31/36  brain]
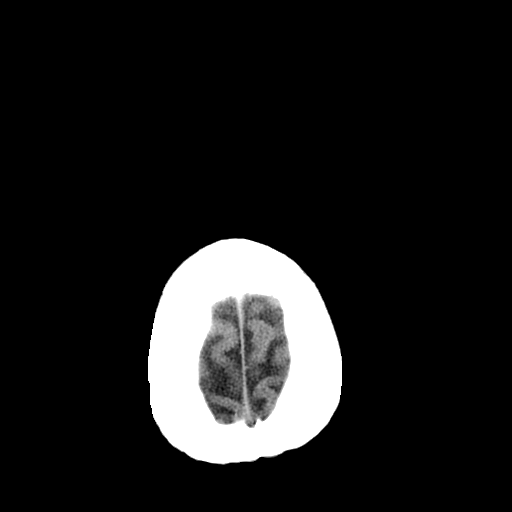
[im 33/36  brain]
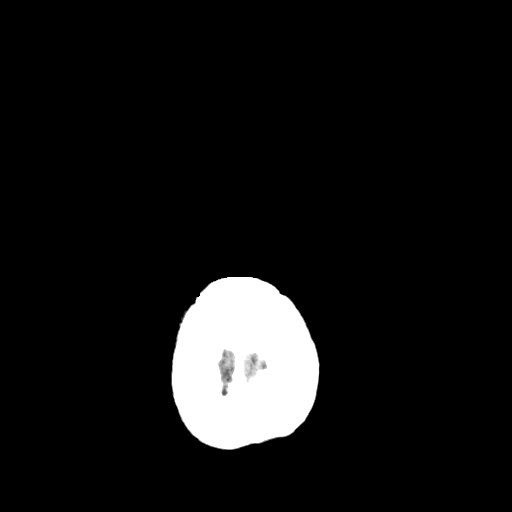
[im 33/36  bone]
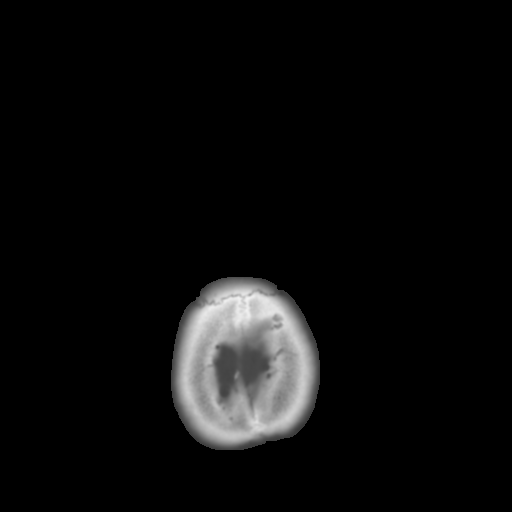

[13 of 30 positions shown; findings below may reference images not displayed]

FINDINGS: The ventricles are normal.  No extra-axial fluid
collections are seen.  The brainstem and cerebellum are
unremarkable.  No acute intracranial findings such as infarction or
hemorrhage.  No mass lesions.

The bony calvarium is intact.  The visualized paranasal sinuses and
mastoid air cells are clear.
IMPRESSION: No acute intracranial findings or mass lesions.

## 2011-05-22 IMAGING — CR DG CHEST 1V PORT
1 series · 1 of 1 positions shown · non-contrast
Comparison: None.

CLINICAL DATA: Hyperglycemia

PORTABLE CHEST - 1 VIEW

[view not recorded]
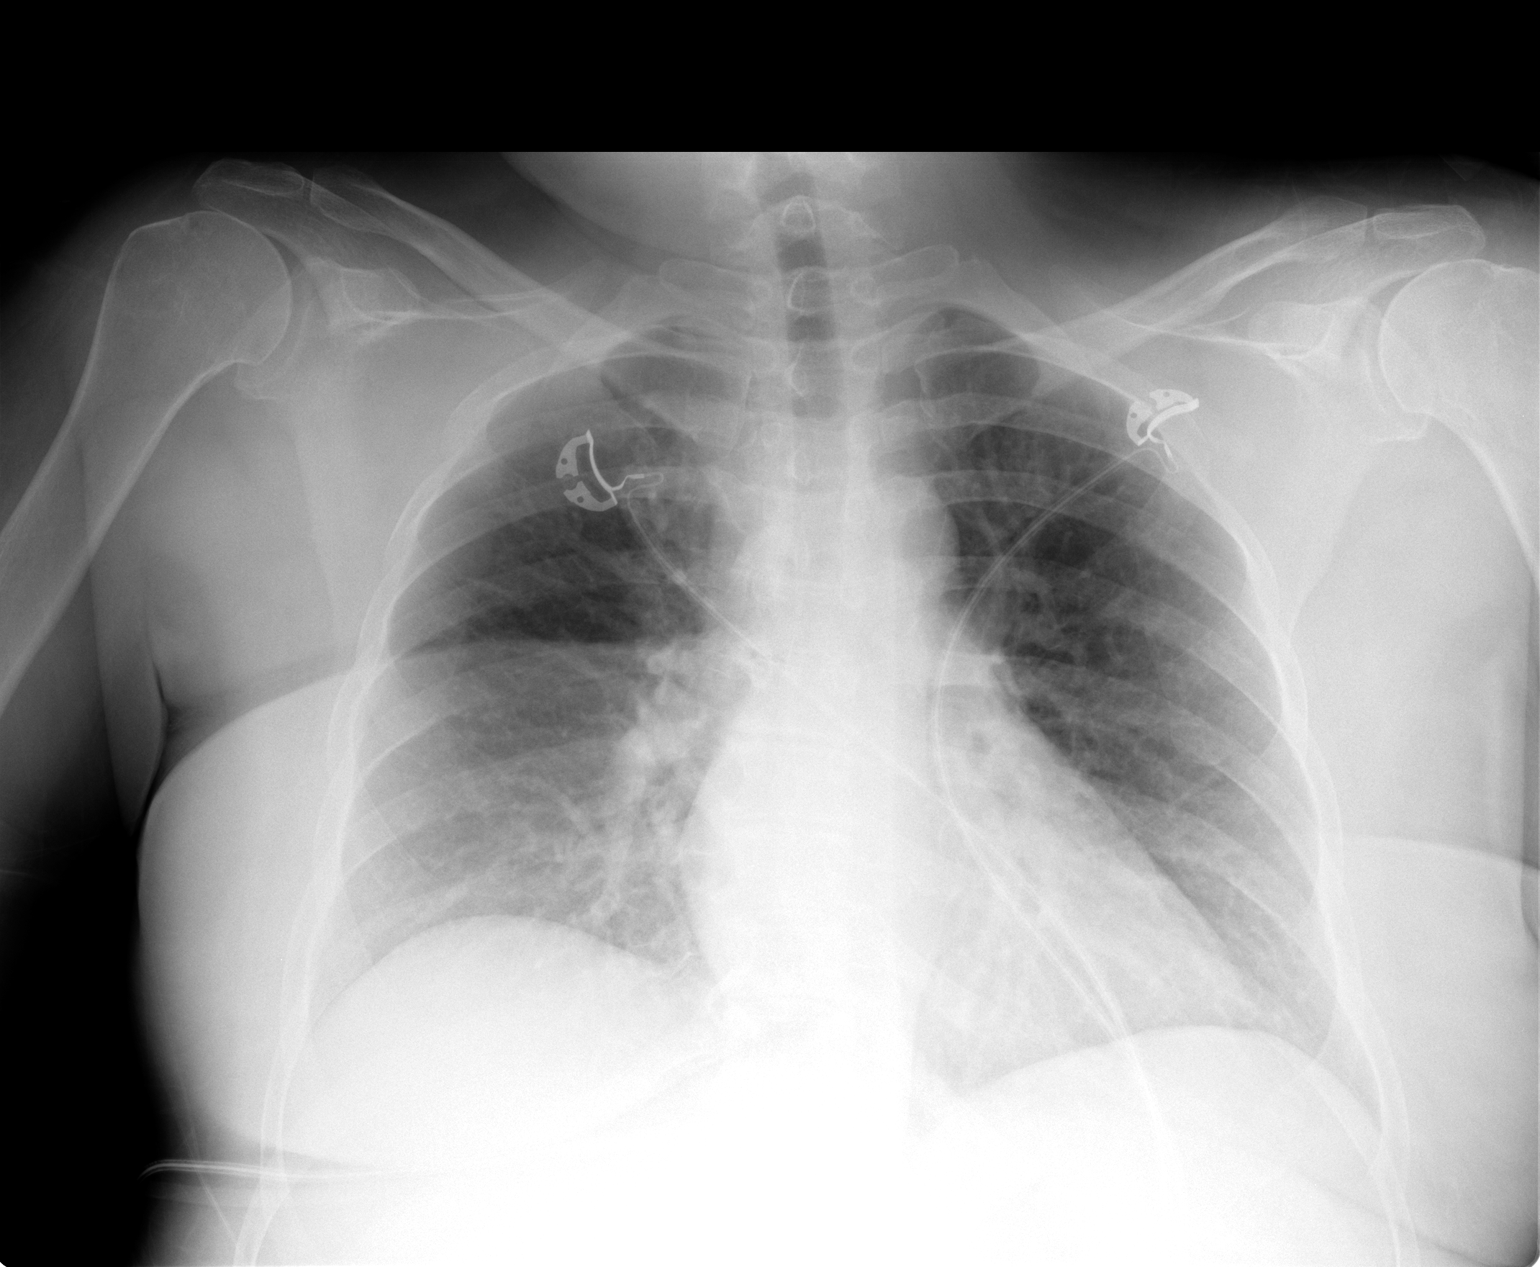

[1 of 1 positions shown; findings below may reference images not displayed]

FINDINGS: Normal heart size.  Clear lungs.  Low volumes.  No
pneumothorax.
IMPRESSION: No active cardiopulmonary disease.

## 2016-10-12 DIAGNOSIS — E108 Type 1 diabetes mellitus with unspecified complications: Secondary | ICD-10-CM | POA: Diagnosis not present

## 2017-02-26 DIAGNOSIS — E108 Type 1 diabetes mellitus with unspecified complications: Secondary | ICD-10-CM | POA: Diagnosis not present

## 2017-03-01 DIAGNOSIS — E108 Type 1 diabetes mellitus with unspecified complications: Secondary | ICD-10-CM | POA: Diagnosis not present

## 2017-06-06 DIAGNOSIS — E108 Type 1 diabetes mellitus with unspecified complications: Secondary | ICD-10-CM | POA: Diagnosis not present

## 2017-06-06 DIAGNOSIS — R82998 Other abnormal findings in urine: Secondary | ICD-10-CM | POA: Diagnosis not present

## 2017-06-06 DIAGNOSIS — Z0000 Encounter for general adult medical examination without abnormal findings: Secondary | ICD-10-CM | POA: Diagnosis not present

## 2017-06-13 DIAGNOSIS — Z1389 Encounter for screening for other disorder: Secondary | ICD-10-CM | POA: Diagnosis not present

## 2017-06-13 DIAGNOSIS — Z23 Encounter for immunization: Secondary | ICD-10-CM | POA: Diagnosis not present

## 2017-06-13 DIAGNOSIS — E11319 Type 2 diabetes mellitus with unspecified diabetic retinopathy without macular edema: Secondary | ICD-10-CM | POA: Diagnosis not present

## 2017-06-13 DIAGNOSIS — Z0000 Encounter for general adult medical examination without abnormal findings: Secondary | ICD-10-CM | POA: Diagnosis not present

## 2017-06-13 DIAGNOSIS — E1039 Type 1 diabetes mellitus with other diabetic ophthalmic complication: Secondary | ICD-10-CM | POA: Diagnosis not present

## 2017-06-21 DIAGNOSIS — I10 Essential (primary) hypertension: Secondary | ICD-10-CM | POA: Diagnosis not present

## 2017-06-21 DIAGNOSIS — E1039 Type 1 diabetes mellitus with other diabetic ophthalmic complication: Secondary | ICD-10-CM | POA: Diagnosis not present

## 2017-06-21 DIAGNOSIS — Z4681 Encounter for fitting and adjustment of insulin pump: Secondary | ICD-10-CM | POA: Diagnosis not present

## 2017-08-21 DIAGNOSIS — E108 Type 1 diabetes mellitus with unspecified complications: Secondary | ICD-10-CM | POA: Diagnosis not present

## 2017-08-21 DIAGNOSIS — E109 Type 1 diabetes mellitus without complications: Secondary | ICD-10-CM | POA: Diagnosis not present

## 2017-11-18 DIAGNOSIS — E109 Type 1 diabetes mellitus without complications: Secondary | ICD-10-CM | POA: Diagnosis not present

## 2017-11-18 DIAGNOSIS — E108 Type 1 diabetes mellitus with unspecified complications: Secondary | ICD-10-CM | POA: Diagnosis not present

## 2018-02-20 DIAGNOSIS — E109 Type 1 diabetes mellitus without complications: Secondary | ICD-10-CM | POA: Diagnosis not present

## 2018-02-20 DIAGNOSIS — E108 Type 1 diabetes mellitus with unspecified complications: Secondary | ICD-10-CM | POA: Diagnosis not present

## 2020-05-17 ENCOUNTER — Other Ambulatory Visit: Payer: Self-pay | Admitting: Internal Medicine

## 2020-05-17 DIAGNOSIS — Z0000 Encounter for general adult medical examination without abnormal findings: Secondary | ICD-10-CM

## 2021-07-11 ENCOUNTER — Other Ambulatory Visit: Payer: Self-pay | Admitting: Internal Medicine

## 2021-07-11 DIAGNOSIS — Z1231 Encounter for screening mammogram for malignant neoplasm of breast: Secondary | ICD-10-CM

## 2021-07-12 ENCOUNTER — Other Ambulatory Visit: Payer: Self-pay | Admitting: Internal Medicine

## 2021-07-12 DIAGNOSIS — Z0000 Encounter for general adult medical examination without abnormal findings: Secondary | ICD-10-CM

## 2021-08-11 ENCOUNTER — Other Ambulatory Visit: Payer: Self-pay

## 2021-09-08 ENCOUNTER — Other Ambulatory Visit: Payer: Self-pay

## 2021-10-06 ENCOUNTER — Other Ambulatory Visit: Payer: Self-pay

## 2021-10-20 ENCOUNTER — Ambulatory Visit
Admission: RE | Admit: 2021-10-20 | Discharge: 2021-10-20 | Disposition: A | Discharge status: Home / Self Care | Payer: Self-pay | Source: Ambulatory Visit | Attending: Internal Medicine | Admitting: Internal Medicine

## 2021-10-20 DIAGNOSIS — Z0000 Encounter for general adult medical examination without abnormal findings: Secondary | ICD-10-CM

## 2021-11-15 ENCOUNTER — Ambulatory Visit: Payer: Self-pay | Admitting: Cardiovascular Disease

## 2021-12-21 NOTE — Progress Notes (Signed)
Cardiology Office Note:   Date:  12/22/2021  NAME:  Christine Hess    MRN: [id number] DOB:  04-25-1966   PCP:  Creola Corn, MD  Cardiologist:  None  Electrophysiologist:  None   Referring MD: Creola Corn, MD   Chief Complaint  Patient presents with   Palpitations    History of Present Illness:   Christine Hess is a 56 y.o. female with a hx of DM, HTN, HLD, CAC who is being seen today for the evaluation of palpitations at the request of Creola Corn, MD. she reports for the past 2 years she has had episodes of palpitations.  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  She was started on carvedilol with improvement in symptoms.  Symptoms are occurring infrequently.  Maybe once per week and last seconds.  She has never wore a monitor.  Symptoms happened after COVID-19 vaccination.  She has a longstanding history of type 1 diabetes.  This is well controlled.  Recent coronary calcium scoring was elevated.  She was started on Crestor despite low LDL cholesterol.  I do agree with this.  Her blood pressure is well controlled.  She is obese with a BMI of 39.  She is not exercising.  She works for [employer].  She is married.  No children.  She reports she does not smoke.  No alcohol use or drug use.  Denies chest pain or shortness of breath today.  Family history notable for atrial fibrillation.  Problem List CAC -score 269 (99th percentile) 2. DM -A1c 6.8 3. HLD -T chol 110, HDL 55, TG 28, LDL 49 4. HTN  Past Medical History: Past Medical History:  Diagnosis Date   Diabetes (HCC)    Hypertension     Past Surgical History: Past Surgical History:  Procedure Laterality Date   CHOLECYSTECTOMY OPEN     PILONIDAL CYST EXCISION     REFRACTIVE SURGERY Bilateral    TONSILLECTOMY      Current Medications: Current Meds  Medication Sig   carvedilol (COREG) 3.125 MG tablet Take 3.125 mg by mouth 2 (two) times daily.   insulin lispro (HUMALOG) 100 UNIT/ML injection USE UP TO 100  UNITS DAILY VIA INSULIN PUMP for 90   lisinopril (ZESTRIL) 20 MG tablet Take 20 mg by mouth daily.   rosuvastatin (CRESTOR) 10 MG tablet Take 10 mg by mouth daily.     Allergies:    Procaine   Social History: Social History   Socioeconomic History   Marital status: Married    Spouse name: Not on file   Number of children: Not on file   Years of education: Not on file   Highest education level: Not on file  Occupational History   Occupation: [employer]  Tobacco Use   Smoking status: Never   Smokeless tobacco: Never  Substance and Sexual Activity   Alcohol use: Never   Drug use: Never   Sexual activity: Not on file  Other Topics Concern   Not on file  Social History Narrative   Not on file   Social Determinants of Health   Financial Resource Strain: Not on file  Food Insecurity: Not on file  Transportation Needs: Not on file  Physical Activity: Not on file  Stress: Not on file  Social Connections: Not on file     Family History: The patient's family history includes Arrhythmia in her father and mother.  ROS:   All other ROS reviewed and negative. Pertinent  positives noted in the HPI.     EKGs/Labs/Other Studies Reviewed:   The following studies were personally reviewed by me today:  EKG:  EKG is ordered today.  The ekg ordered today demonstrates normal sinus rhythm heart rate 78, nonspecific ST-T changes, and was personally reviewed by me.   Recent Labs: No results found for requested labs within last 365 days.   Recent Lipid Panel    Component Value Date/Time   CHOL  10/17/2009 0552    78        ATP III CLASSIFICATION:  <200     mg/dL   Desirable  741-287  mg/dL   Borderline High  >=867    mg/dL   High          TRIG 52 10/17/2009 0552   HDL 47 10/17/2009 0552   CHOLHDL 1.7 10/17/2009 0552   VLDL 10 10/17/2009 0552   LDLCALC  10/17/2009 0552    21        Total Cholesterol/HDL:CHD Risk Coronary Heart Disease Risk Table                      Men   Women  1/2 Average Risk   3.4   3.3  Average Risk       5.0   4.4  2 X Average Risk   9.6   7.1  3 X Average Risk  23.4   11.0        Use the calculated Patient Ratio above and the CHD Risk Table to determine the patient's CHD Risk.        ATP III CLASSIFICATION (LDL):  <100     mg/dL   Optimal  672-094  mg/dL   Near or Above                    Optimal  130-159  mg/dL   Borderline  709-628  mg/dL   High  >366     mg/dL   Very High    Physical Exam:   VS:  BP 122/74 (BP Location: Left Arm, Patient Position: Sitting, Cuff Size: Large)   Pulse 78   Ht 5\' 4"  (1.626 m)   Wt 233 lb (105.7 kg)   BMI 39.99 kg/m    Wt Readings from Last 3 Encounters:  12/22/21 233 lb (105.7 kg)    General: Well nourished, well developed, in no acute distress Head: Atraumatic, normal size  Eyes: PEERLA, EOMI  Neck: Supple, no JVD Endocrine: No thryomegaly Cardiac: Normal S1, S2; RRR; no murmurs, rubs, or gallops Lungs: Clear to auscultation bilaterally, no wheezing, rhonchi or rales  Abd: Soft, nontender, no hepatomegaly  Ext: No edema, pulses 2+ Musculoskeletal: No deformities, BUE and BLE strength normal and equal Skin: Warm and dry, no rashes   Neuro: Alert and oriented to person, place, time, and situation, CNII-XII grossly intact, no focal deficits  Psych: Normal mood and affect   ASSESSMENT:   Christine Hess is a 56 y.o. female who presents for the following: 1. Palpitations   2. Agatston coronary artery calcium score between 200 and 399   3. Mixed hyperlipidemia     PLAN:   1. Palpitations -History of palpitations since COVID-19 infection.  CV exam is normal.  EKG is normal.  Diagnosed with PVCs.  She has never wore a monitor.  Would recommend proceed with 14-day ZIO.  Recent thyroid normal.  Exam normal.  No need for an echo at this  time.  2. Agatston coronary artery calcium score between 200 and 399 3. Mixed hyperlipidemia -Elevated coronary calcium score related diabetes.   No symptoms of angina.  Agree with Crestor low-dose.  LDL cholesterol is acceptable.  She will see [place] yearly.  Disposition: Return if symptoms worsen or fail to improve.  Medication Adjustments/Labs and Tests Ordered: Current medicines are reviewed at length with the patient today.  Concerns regarding medicines are outlined above.  Orders Placed This Encounter  Procedures   LONG TERM MONITOR (3-14 DAYS)   EKG 12-Lead   No orders of the defined types were placed in this encounter.   Patient Instructions  Medication Instructions:  The current medical regimen is effective;  continue present plan and medications.  *If you need a refill on your cardiac medications before your next appointment, please call your pharmacy*   Testing/Procedures:  ZIO XT- Long Term Monitor Instructions  Your physician has requested you wear a ZIO patch monitor for 14 days.  This is a single patch monitor. [employer] supplies one patch monitor per enrollment. Additional stickers are not available. Please do not apply patch if you will be having a Nuclear Stress Test,  Echocardiogram, Cardiac CT, MRI, or Chest Xray during the period you would be wearing the  monitor. The patch cannot be worn during these tests. You cannot remove and re-apply the  ZIO XT patch monitor.  Your ZIO patch monitor will be mailed 3 day USPS to your address on file. It may take 3-5 days  to receive your monitor after you have been enrolled.  Once you have received your monitor, please review the enclosed instructions. Your monitor  has already been registered assigning a specific monitor serial # to you.  Billing and Patient Assistance Program Information  We have supplied [employer] with any of your insurance information on file for billing purposes. [employer] offers a sliding scale Patient Assistance Program for patients that do not have  insurance, or whose insurance does not completely cover the cost of the ZIO monitor.  You must  apply for the Patient Assistance Program to qualify for this discounted rate.  To apply, please call [employer] at [phone], select option 4, select option 2, ask to apply for  Patient Assistance Program. Meredeth Ide will ask your household income, and how many people  are in your household. They will quote your out-of-pocket cost based on that information.  [employer] will also be able to set up a 76-month, interest-free payment plan if needed.  Applying the monitor   Shave hair from upper left chest.  Hold abrader disc by orange tab. Rub abrader in 40 strokes over the upper left chest as  indicated in your monitor instructions.  Clean area with 4 enclosed alcohol pads. Let dry.  Apply patch as indicated in monitor instructions. Patch will be placed under collarbone on left  side of chest with arrow pointing upward.  Rub patch adhesive wings for 2 minutes. Remove white label marked "1". Remove the white  label marked "2". Rub patch adhesive wings for 2 additional minutes.  While looking in a mirror, press and release button in center of patch. A small green light will  flash 3-4 times. This will be your only indicator that the monitor has been turned on.  Do not shower for the first 24 hours. You may shower after the first 24 hours.  Press the button if you feel a symptom. You will hear a small click. Record Date, Time and  Symptom  in the Patient Logbook.  When you are ready to remove the patch, follow instructions on the last 2 pages of Patient  Logbook. Stick patch monitor onto the last page of Patient Logbook.  Place Patient Logbook in the blue and white box. Use locking tab on box and tape box closed  securely. The blue and white box has prepaid postage on it. Please place it in the mailbox as  soon as possible. Your physician should have your test results approximately 7 days after the  monitor has been mailed back to [facility].  Call [facility] Customer Care at [phone] if  you have questions regarding  your ZIO XT patch monitor. Call them immediately if you see an orange light blinking on your  monitor.  If your monitor falls off in less than 4 days, contact our Monitor department at [phone].  If your monitor becomes loose or falls off after 4 days call [employer] at [phone] for  suggestions on securing your monitor    Follow-Up: At [facility], you and your health needs are our priority.  As part of our continuing mission to provide you with exceptional heart care, we have created designated Provider Care Teams.  These Care Teams include your primary Cardiologist (physician) and Advanced Practice Providers (APPs -  Physician Assistants and Nurse Practitioners) who all work together to provide you with the care you need, when you need it.  We recommend signing up for the patient portal called "MyChart".  Sign up information is provided on this After Visit Summary.  MyChart is used to connect with patients for Virtual Visits (Telemedicine).  Patients are able to view lab/test results, encounter notes, upcoming appointments, etc.  Non-urgent messages can be sent to your provider as well.   To learn more about what you can do with MyChart, go to [email or web address].    Your next appointment:   12 month(s)  The format for your next appointment:   In Person  Provider:   Lennie Odor, MD              Signed, Lenna Gilford. Flora Lipps, MD, [facility]  [facility]  [address], [address] [place], [place] [address] [phone]  12/22/2021 2:13 PM

## 2021-12-22 ENCOUNTER — Encounter: Payer: Self-pay | Admitting: Cardiovascular Disease

## 2021-12-22 ENCOUNTER — Ambulatory Visit (INDEPENDENT_AMBULATORY_CARE_PROVIDER_SITE_OTHER): Payer: 59 | Admitting: Cardiovascular Disease

## 2021-12-22 ENCOUNTER — Ambulatory Visit (INDEPENDENT_AMBULATORY_CARE_PROVIDER_SITE_OTHER): Payer: 59

## 2021-12-22 VITALS — BP 122/74 | HR 78 | Ht 64.0 in | Wt 233.0 lb

## 2021-12-22 DIAGNOSIS — R931 Abnormal findings on diagnostic imaging of heart and coronary circulation: Secondary | ICD-10-CM | POA: Diagnosis not present

## 2021-12-22 DIAGNOSIS — E782 Mixed hyperlipidemia: Secondary | ICD-10-CM | POA: Diagnosis not present

## 2021-12-22 DIAGNOSIS — R0602 Shortness of breath: Secondary | ICD-10-CM

## 2021-12-22 DIAGNOSIS — R002 Palpitations: Secondary | ICD-10-CM

## 2021-12-22 NOTE — Progress Notes (Unsigned)
Enrolled for [employer] to mail a ZIO XT long term holter monitor to the patients address on file.  

## 2021-12-22 NOTE — Patient Instructions (Signed)
Medication Instructions:  The current medical regimen is effective;  continue present plan and medications.  *If you need a refill on your cardiac medications before your next appointment, please call your pharmacy*   Testing/Procedures:  [place] Monitor Instructions  Your physician has requested you wear a ZIO patch monitor for 14 days.  This is a single patch monitor. [employer] supplies one patch monitor per enrollment. Additional stickers are not available. Please do not apply patch if you will be having a Nuclear Stress Test,  Echocardiogram, Cardiac CT, MRI, or Chest Xray during the period you would be wearing the  monitor. The patch cannot be worn during these tests. You cannot remove and re-apply the  ZIO XT patch monitor.  Your ZIO patch monitor will be mailed 3 day USPS to your address on file. It may take 3-5 days  to receive your monitor after you have been enrolled.  Once you have received your monitor, please review the enclosed instructions. Your monitor  has already been registered assigning a specific monitor serial # to you.  Billing and Patient Assistance Program Information  We have supplied [employer] with any of your insurance information on file for billing purposes. [employer] offers a sliding scale Patient Assistance Program for patients that do not have  insurance, or whose insurance does not completely cover the cost of the ZIO monitor.  You must apply for the Patient Assistance Program to qualify for this discounted rate.  To apply, please call [employer] at [phone], select option 4, select option 2, ask to apply for  Patient Assistance Program. Theodore Demark will ask your household income, and how many people  are in your household. They will quote your out-of-pocket cost based on that information.  [employer] will also be able to set up a 29-month interest-free payment plan if needed.  Applying the monitor   Shave hair from upper left chest.  Hold abrader  disc by orange tab. Rub abrader in 40 strokes over the upper left chest as  indicated in your monitor instructions.  Clean area with 4 enclosed alcohol pads. Let dry.  Apply patch as indicated in monitor instructions. Patch will be placed under collarbone on left  side of chest with arrow pointing upward.  Rub patch adhesive wings for 2 minutes. Remove white label marked "1". Remove the white  label marked "2". Rub patch adhesive wings for 2 additional minutes.  While looking in a mirror, press and release button in center of patch. A small green light will  flash 3-4 times. This will be your only indicator that the monitor has been turned on.  Do not shower for the first 24 hours. You may shower after the first 24 hours.  Press the button if you feel a symptom. You will hear a small click. Record Date, Time and  Symptom in the Patient Logbook.  When you are ready to remove the patch, follow instructions on the last 2 pages of Patient  Logbook. Stick patch monitor onto the last page of Patient Logbook.  Place Patient Logbook in the blue and white box. Use locking tab on box and tape box closed  securely. The blue and white box has prepaid postage on it. Please place it in the mailbox as  soon as possible. Your physician should have your test results approximately 7 days after the  monitor has been [facility] [address]  Call [employer] [phone] you have questions regarding  your ZIO  XT patch monitor. Call them immediately if you see an orange light blinking on your  monitor.  If your monitor falls off in less than 4 days, contact our Monitor department at [phone].  If your monitor becomes loose or falls off after 4 days call [employer] at [phone] for  suggestions on securing your monitor    Follow-Up: At [facility], you and your health needs are our priority.  As part of our continuing mission to provide you with exceptional heart care, we  have created designated Provider Care Teams.  These Care Teams include your primary Cardiologist (physician) and Advanced Practice Providers (APPs -  Physician Assistants and Nurse Practitioners) who all work together to provide you with the care you need, when you need it.  We recommend signing up for the patient portal called "MyChart".  Sign up information is provided on this After Visit Summary.  MyChart is used to connect with patients for Virtual Visits (Telemedicine).  Patients are able to view lab/test results, encounter notes, upcoming appointments, etc.  Non-urgent messages can be sent to your provider as well.   To learn more about what you can do with MyChart, go to [email or web address].    Your next appointment:   12 month(s)  The format for your next appointment:   In Person  Provider:   Lennie Odor, MD

## 2021-12-27 ENCOUNTER — Ambulatory Visit: Payer: Self-pay | Admitting: Gastroenterology

## 2022-01-02 DIAGNOSIS — R002 Palpitations: Secondary | ICD-10-CM

## 2022-02-07 ENCOUNTER — Other Ambulatory Visit: Payer: Self-pay | Admitting: *Deleted

## 2022-02-07 DIAGNOSIS — I4719 Other supraventricular tachycardia: Secondary | ICD-10-CM

## 2022-02-07 MED ORDER — METOPROLOL SUCCINATE ER 25 MG PO TB24: 25.0000 mg | ORAL_TABLET | Freq: Every day | ORAL | 3 refills | Status: AC

## 2022-02-16 ENCOUNTER — Ambulatory Visit (INDEPENDENT_AMBULATORY_CARE_PROVIDER_SITE_OTHER): Payer: 59 | Admitting: Gastroenterology

## 2022-02-16 ENCOUNTER — Encounter: Payer: Self-pay | Admitting: Gastroenterology

## 2022-02-16 VITALS — BP 132/70 | HR 72 | Ht 64.0 in | Wt 243.0 lb

## 2022-02-16 DIAGNOSIS — Z1211 Encounter for screening for malignant neoplasm of colon: Secondary | ICD-10-CM

## 2022-02-16 DIAGNOSIS — R002 Palpitations: Secondary | ICD-10-CM | POA: Diagnosis not present

## 2022-02-16 DIAGNOSIS — E119 Type 2 diabetes mellitus without complications: Secondary | ICD-10-CM | POA: Diagnosis not present

## 2022-02-16 DIAGNOSIS — Z794 Long term (current) use of insulin: Secondary | ICD-10-CM

## 2022-02-16 NOTE — Patient Instructions (Addendum)
It has been recommended to you by your physician that you have a(n) colonoscopy completed. Per your request, we did not schedule the procedure(s) today. Please contact our office at [phone] should you decide to have the procedure completed. You will be scheduled for a pre-visit and procedure at that time.   If you are age 54 or younger, your body mass index should be between 19-25. Your Body mass index is 41.71 kg/m. If this is out of the aformentioned range listed, please consider follow up with your Primary Care Provider.   __________________________________________________________  The [facility] providers would like to encourage you to use [facility] to communicate with providers for non-urgent requests or questions.  Due to long hold times on the telephone, sending your provider a message by [facility] may be a faster and more efficient way to get a response.  Please allow 48 business hours for a response.  Please remember that this is for non-urgent requests.   Due to recent changes in healthcare laws, you may see the results of your imaging and laboratory studies on MyChart before your provider has had a chance to review them.  We understand that in some cases there may be results that are confusing or concerning to you. Not all laboratory results come back in the same time frame and the provider may be waiting for multiple results in order to interpret others.  Please give [place] 48 hours in order for your provider to thoroughly review all the results before contacting the office for clarification of your results.     Thank you for choosing me and [facility].  Vito Cirigliano, D.O.

## 2022-02-16 NOTE — Progress Notes (Signed)
              Chief Complaint: Discuss colon cancer screening, medication management in the perioperative setting   Referring Provider:     Shon Baton, MD    HPI:     Christine Hess is a 57 y.o. female with history of diabetes, HTN, HLD, referred to the Gastroenterology Clinic to discuss options for colon cancer screening and medication management in the perioperative setting.  History of diabetes, treated with insulin pump.  No prior colonoscopy or CRC screening.   No GI sxs.   Tends to have BG fall into 40's when fasting. DM and insulin pump managed by Dr. Shon Baton.   Has been having palpitations. Was seen in the Cardiology Clinic in August, completed Holter, and has TTE and f/u next week.   No known family history of CRC, GI malignancy, liver disease, pancreatic disease, or IBD.     Past Medical History:  Diagnosis Date   Diabetes ([place])    Hypertension      Past Surgical History:  Procedure Laterality Date   CHOLECYSTECTOMY OPEN     PILONIDAL CYST EXCISION     REFRACTIVE SURGERY Bilateral    TONSILLECTOMY     Family History  Problem Relation Age of Onset   Arrhythmia Mother    Arrhythmia Father    Social History   Tobacco Use   Smoking status: Never   Smokeless tobacco: Never  Substance Use Topics   Alcohol use: Never   Drug use: Never   Current Outpatient Medications  Medication Sig Dispense Refill   insulin lispro (HUMALOG) 100 UNIT/ML injection USE UP TO 100 UNITS DAILY VIA INSULIN PUMP for 90     lisinopril (ZESTRIL) 20 MG tablet Take 20 mg by mouth daily.     metoprolol succinate (TOPROL XL) 25 MG 24 hr tablet Take 1 tablet (25 mg total) by mouth daily. 90 tablet 3   rosuvastatin (CRESTOR) 10 MG tablet Take 10 mg by mouth daily.     No current facility-administered medications for this visit.   Allergies  Allergen Reactions   Other     Other reaction(s): Syncope Uncoded Allergy. Allergen: novacaine   Procaine     Other reaction(s):  Unknown     Review of Systems: All systems reviewed and negative except where noted in HPI.     Physical Exam:    Wt Readings from Last 3 Encounters:  02/16/22 243 lb (110.2 kg)  12/22/21 233 lb (105.7 kg)    BP 132/70   Pulse 72   Ht 5\' 4"  (1.626 m)   Wt 243 lb (110.2 kg)   BMI 41.71 kg/m  Constitutional:  Pleasant, in no acute distress. Psychiatric: Normal mood and affect. Behavior is normal. EENT: Pupils normal.  Conjunctivae are normal. No scleral icterus. Neck supple. No cervical LAD. Cardiovascular: Normal rate, regular rhythm. No edema Pulmonary/chest: Effort normal and breath sounds normal. No wheezing, rales or rhonchi. Abdominal: Soft, nondistended, nontender. Bowel sounds active throughout. There are no masses palpable. No hepatomegaly. Neurological: Alert and oriented to person place and time. Skin: Skin is warm and dry. No rashes noted.   ASSESSMENT AND PLAN;   1)  - Wll call us - Will need 8 AM appt    Lavena Bullion, DO, FACG  02/16/2022, 11:24 AM   Shon Baton, MD

## 2022-02-23 ENCOUNTER — Ambulatory Visit (HOSPITAL_COMMUNITY): Payer: 59 | Attending: Cardiovascular Disease

## 2022-02-23 DIAGNOSIS — I4719 Other supraventricular tachycardia: Secondary | ICD-10-CM

## 2022-02-23 LAB — ECHOCARDIOGRAM COMPLETE
Area-P 1/2: 3.6 cm2
S' Lateral: 2.8 cm

## 2022-02-26 NOTE — Progress Notes (Unsigned)
Cardiology Office Note:   Date:  02/28/2022  NAME:  Christine Hess    MRN: [id number] DOB:  [birth year]   PCP:  Shon Baton, MD  Cardiologist:  None  Electrophysiologist:  None   Referring MD: Shon Baton, MD   Chief Complaint  Patient presents with   Follow-up        History of Present Illness:   Christine Hess is a 56 y.o. female with a hx of atrial tachycardia who presents for follow-up.  Evaluated for palpitations.  Monitor showed brief atrial tachycardia episodes.  Started on metoprolol.  She reports no further palpitations since starting metoprolol succinate.  Doing well on this medication.  Blood pressure is well controlled.  Cholesterol well controlled.  Denies any chest pain or trouble breathing.  We discussed starting an exercise regimen.  She will look into this.  Symptoms are much improved.  We also discussed weight loss.  Her diabetes is well controlled.  She is working with her primary care physician on this.  Echocardiogram was normal.  No symptoms concerning for angina.  Problem List CAC -score 269 (99th percentile) 2. DM -A1c 6.8 3. HLD -T chol 110, HDL 55, TG 28, LDL 49 4. HTN  Past Medical History: Past Medical History:  Diagnosis Date   Diabetes ([place])    Hypertension     Past Surgical History: Past Surgical History:  Procedure Laterality Date   CHOLECYSTECTOMY OPEN     PILONIDAL CYST EXCISION     REFRACTIVE SURGERY Bilateral    TONSILLECTOMY      Current Medications: Current Meds  Medication Sig   insulin lispro (HUMALOG) 100 UNIT/ML injection USE UP TO 100 UNITS DAILY VIA INSULIN PUMP for 90   lisinopril (ZESTRIL) 20 MG tablet Take 20 mg by mouth daily.   metoprolol succinate (TOPROL XL) 25 MG 24 hr tablet Take 1 tablet (25 mg total) by mouth daily.   rosuvastatin (CRESTOR) 10 MG tablet Take 10 mg by mouth daily.     Allergies:    Other and Procaine   Social History: Social History   Socioeconomic History   Marital status: Married     Spouse name: Not on file   Number of children: Not on file   Years of education: Not on file   Highest education level: Not on file  Occupational History   Occupation: Office [employer]  Tobacco Use   Smoking status: Never   Smokeless tobacco: Never  Substance and Sexual Activity   Alcohol use: Never   Drug use: Never   Sexual activity: Not on file  Other Topics Concern   Not on file  Social History Narrative   Not on file   Social Determinants of Health   Financial Resource Strain: Not on file  Food Insecurity: Not on file  Transportation Needs: Not on file  Physical Activity: Not on file  Stress: Not on file  Social Connections: Not on file     Family History: The patient's family history includes Arrhythmia in her father and mother.  ROS:   All other ROS reviewed and negative. Pertinent positives noted in the HPI.     EKGs/Labs/Other Studies Reviewed:   The following studies were personally reviewed by me today:   TTE 02/23/2022  1. Left ventricular ejection fraction, by estimation, is 55 to 60%. The  left ventricle has normal function. The left ventricle has no regional  wall motion abnormalities. Left ventricular diastolic parameters were  normal. The average left  ventricular  global longitudinal strain is -20.4 %. The global longitudinal strain is  normal.   2. Right ventricular systolic function is normal. The right ventricular  size is normal.   3. Left atrial size was mildly dilated.   4. The mitral valve is abnormal. No evidence of mitral valve  regurgitation. No evidence of mitral stenosis.   5. The aortic valve is tricuspid. Aortic valve regurgitation is not  visualized. No aortic stenosis is present.   6. The inferior vena cava is normal in size with greater than 50%  respiratory variability, suggesting right atrial pressure of 3 mmHg.   [facility] 02/04/2022 Impression: Paroxysmal supraventricular tachycardia detected (327 episodes, longest duration  16.7 seconds).  Rare ectopy.   Recent Labs: No results found for requested labs within last 365 days.   Recent Lipid Panel    Component Value Date/Time   CHOL  10/17/2009 0552    78        ATP III CLASSIFICATION:  <200     mg/dL   Desirable  200-239  mg/dL   Borderline High  >=240    mg/dL   High          TRIG 52 10/17/2009 0552   HDL 47 10/17/2009 0552   CHOLHDL 1.7 10/17/2009 0552   VLDL 10 10/17/2009 0552   LDLCALC  10/17/2009 0552    21        Total Cholesterol/HDL:CHD Risk Coronary Heart Disease Risk Table                     Men   Women  1/2 Average Risk   3.4   3.3  Average Risk       5.0   4.4  2 X Average Risk   9.6   7.1  3 X Average Risk  23.4   11.0        Use the calculated Patient Ratio above and the CHD Risk Table to determine the patient's CHD Risk.        ATP III CLASSIFICATION (LDL):  <100     mg/dL   Optimal  100-129  mg/dL   Near or Above                    Optimal  130-159  mg/dL   Borderline  160-189  mg/dL   High  >190     mg/dL   Very High    Physical Exam:   VS:  BP (!) 140/77   Pulse 70   Ht 5\' 4"  (1.626 m)   Wt 241 lb 3.2 oz (109.4 kg)   SpO2 98%   BMI 41.40 kg/m    Wt Readings from Last 3 Encounters:  02/28/22 241 lb 3.2 oz (109.4 kg)  02/16/22 243 lb (110.2 kg)  12/22/21 233 lb (105.7 kg)    General: Well nourished, well developed, in no acute distress Head: Atraumatic, normal size  Eyes: PEERLA, EOMI  Neck: Supple, no JVD Endocrine: No thryomegaly Cardiac: Normal S1, S2; RRR; no murmurs, rubs, or gallops Lungs: Clear to auscultation bilaterally, no wheezing, rhonchi or rales  Abd: Soft, nontender, no hepatomegaly  Ext: No edema, pulses 2+ Musculoskeletal: No deformities, BUE and BLE strength normal and equal Skin: Warm and dry, no rashes   Neuro: Alert and oriented to person, place, time, and situation, CNII-XII grossly intact, no focal deficits  Psych: Normal mood and affect   ASSESSMENT:   Christine Hess is a 56  y.o. female who presents for the following: 1. Atrial tachycardia   2. Palpitations   3. Agatston coronary artery calcium score between 200 and 399   4. Mixed hyperlipidemia     PLAN:   1. Atrial tachycardia 2. Palpitations -Well-controlled on metoprolol succinate 25 mg daily.  She will continue with caffeine reduction.  She also has noticed hot chocolate may be a trigger.  She is declining a sleep study.  Her blood pressure is well controlled.  She will work with her doctor on diet and exercise.  3. Agatston coronary artery calcium score between 200 and 399 4. Mixed hyperlipidemia -LDL well controlled and at goal.  No symptoms of angina.  Normal echo.  Continue current medications.      Disposition: Return in about 6 months (around 08/29/2022).  Medication Adjustments/Labs and Tests Ordered: Current medicines are reviewed at length with the patient today.  Concerns regarding medicines are outlined above.  No orders of the defined types were placed in this encounter.  No orders of the defined types were placed in this encounter.   Patient Instructions  Medication Instructions:  The current medical regimen is effective;  continue present plan and medications.  *If you need a refill on your cardiac medications before your next appointment, please call your pharmacy*   Follow-Up: At [facility], you and your health needs are our priority.  As part of our continuing mission to provide you with exceptional heart care, we have created designated Provider Care Teams.  These Care Teams include your primary Cardiologist (physician) and Advanced Practice Providers (APPs -  Physician Assistants and Nurse Practitioners) who all work together to provide you with the care you need, when you need it.  We recommend signing up for the patient portal called "MyChart".  Sign up information is provided on this After Visit Summary.  MyChart is used to connect with patients for Virtual Visits  (Telemedicine).  Patients are able to view lab/test results, encounter notes, upcoming appointments, etc.  Non-urgent messages can be sent to your provider as well.   To learn more about what you can do with MyChart, go to [email or web address].    Your next appointment:   6 month(s)  The format for your next appointment:   In Person  Provider:   Eleonore Chiquito, MD          Time Spent with Patient: I have spent a total of 25 minutes with patient reviewing hospital notes, telemetry, EKGs, labs and examining the patient as well as establishing an assessment and plan that was discussed with the patient.  > 50% of time was spent in direct patient care.  Signed, Addison Naegeli. Audie Box, MD, [place]  [address], [place], [place] [address] [phone]  02/28/2022 10:13 AM

## 2022-02-28 ENCOUNTER — Ambulatory Visit: Payer: 59 | Attending: Cardiovascular Disease | Admitting: Cardiovascular Disease

## 2022-02-28 ENCOUNTER — Encounter: Payer: Self-pay | Admitting: Cardiovascular Disease

## 2022-02-28 VITALS — BP 140/77 | HR 70 | Ht 64.0 in | Wt 241.2 lb

## 2022-02-28 DIAGNOSIS — E782 Mixed hyperlipidemia: Secondary | ICD-10-CM | POA: Diagnosis not present

## 2022-02-28 DIAGNOSIS — I4719 Other supraventricular tachycardia: Secondary | ICD-10-CM

## 2022-02-28 DIAGNOSIS — R931 Abnormal findings on diagnostic imaging of heart and coronary circulation: Secondary | ICD-10-CM | POA: Diagnosis not present

## 2022-02-28 DIAGNOSIS — R002 Palpitations: Secondary | ICD-10-CM | POA: Diagnosis not present

## 2022-02-28 NOTE — Patient Instructions (Signed)
Medication Instructions:  The current medical regimen is effective;  continue present plan and medications.  *If you need a refill on your cardiac medications before your next appointment, please call your pharmacy*  Follow-Up: At [facility], you and your health needs are our priority.  As part of our continuing mission to provide you with exceptional heart care, we have created designated Provider Care Teams.  These Care Teams include your primary Cardiologist (physician) and Advanced Practice Providers (APPs -  Physician Assistants and Nurse Practitioners) who all work together to provide you with the care you need, when you need it.  We recommend signing up for the patient portal called "MyChart".  Sign up information is provided on this After Visit Summary.  MyChart is used to connect with patients for Virtual Visits (Telemedicine).  Patients are able to view lab/test results, encounter notes, upcoming appointments, etc.  Non-urgent messages can be sent to your provider as well.   To learn more about what you can do with MyChart, go to [email or web address].    Your next appointment:   6 month(s)  The format for your next appointment:   In Person  Provider:   [place] O'Neal, MD      

## 2024-06-03 ENCOUNTER — Other Ambulatory Visit (HOSPITAL_COMMUNITY): Payer: Self-pay | Admitting: Registered Nurse

## 2024-06-03 DIAGNOSIS — R9389 Abnormal findings on diagnostic imaging of other specified body structures: Secondary | ICD-10-CM

## 2024-06-03 DIAGNOSIS — J90 Pleural effusion, not elsewhere classified: Secondary | ICD-10-CM

## 2024-06-04 ENCOUNTER — Ambulatory Visit (HOSPITAL_COMMUNITY)

## 2024-06-04 ENCOUNTER — Other Ambulatory Visit (HOSPITAL_COMMUNITY)

## 2024-06-05 ENCOUNTER — Ambulatory Visit (HOSPITAL_COMMUNITY): Admission: RE | Admit: 2024-06-05 | Source: Ambulatory Visit

## 2024-06-05 ENCOUNTER — Inpatient Hospital Stay (HOSPITAL_COMMUNITY): Admission: RE | Admit: 2024-06-05

## 2024-06-05 DIAGNOSIS — R9389 Abnormal findings on diagnostic imaging of other specified body structures: Secondary | ICD-10-CM

## 2024-06-05 DIAGNOSIS — J90 Pleural effusion, not elsewhere classified: Secondary | ICD-10-CM

## 2024-06-05 LAB — BODY FLUID CELL COUNT WITH DIFFERENTIAL
Eos, Fluid: 0 %
Lymphs, Fluid: 68 %
Monocyte-Macrophage-Serous Fluid: 2 % — ABNORMAL LOW (ref 50–90)
Neutrophil Count, Fluid: 30 % — ABNORMAL HIGH (ref 0–25)
Total Nucleated Cell Count, Fluid: 6467 uL — ABNORMAL HIGH (ref 0–1000)

## 2024-06-05 LAB — BODY FLUID CULTURE W GRAM STAIN: Special Requests: NORMAL

## 2024-06-05 LAB — POCT I-STAT CREATININE: Creatinine, Ser: 1 mg/dL (ref 0.44–1.00)

## 2024-06-05 MED ORDER — LIDOCAINE HCL 1 % IJ SOLN
20.0000 mL | Freq: Once | INTRAMUSCULAR | Status: AC
  Administered 2024-06-05: 10 mL via INTRADERMAL

## 2024-06-05 MED ORDER — LIDOCAINE-EPINEPHRINE 1 %-1:100000 IJ SOLN
INTRAMUSCULAR | Status: AC
  Filled 2024-06-05: qty 20

## 2024-06-05 MED ORDER — IOHEXOL 300 MG/ML  SOLN
75.0000 mL | Freq: Once | INTRAMUSCULAR | Status: AC | PRN
  Administered 2024-06-05: 75 mL via INTRAVENOUS

## 2024-06-05 MED ORDER — SODIUM CHLORIDE 0.9 % IV SOLN: INTRAVENOUS | Status: AC

## 2024-06-05 MED ORDER — LIDOCAINE HCL 1 % IJ SOLN
INTRAMUSCULAR | Status: AC
  Filled 2024-06-05: qty 20

## 2024-06-05 NOTE — Progress Notes (Signed)
 Called to room by PA and IR tech.  Pt vagaled during thoracentesis.   IV started and NS bolus started

## 2024-06-05 NOTE — Progress Notes (Signed)
Blood glucose 122 per dexcom.

## 2024-06-05 NOTE — Progress Notes (Signed)
Sitting on bedside

## 2024-06-05 NOTE — Progress Notes (Signed)
 Patient ID: Christine Hess, female   DOB: [birth year], 59 y.o.   MRN: [id number] Pt's CT chest has been completed. She tolerated scan ok. She did receive additional IV NS bolus for a total of 1 liter given. Her vital signs were stable. She ambulated and tolerated eating without difficulty. She was discharged home with her husband. Dr. Monia office was notified of events of today and thoracentesis findings. Pt told to f/u with Dr. Onita for further evaluation and to report to ED should she begin to feel worse.

## 2024-06-05 NOTE — Procedures (Addendum)
 Ultrasound-guided diagnostic and therapeutic left thoracentesis performed yielding 1.2 liters of cream colored/chylous appearing fluid.  Medication used -1% lidocaine  to skin/subcutaneous tissue.  During procedure patient experienced a vasovagal reaction with lightheadedness, hypotension, diarrhea.  She was placed in Trendelenburg position and given IV fluid bolus.  She responded to above measures.  Patient reports prior history of vasovagal episodes in the past.  She denied significant chest pain, worsening dyspnea, nausea, vomiting.  Follow-up chest x-ray revealed no pneumothorax.  Pleural fluid was submitted for preordered lab studies.  EBL none.  Patient also scheduled for CT chest today.  Patient's spouse updated with above information. Dr. Hughes updated.
